# Patient Record
Sex: Female | Born: 1950 | Race: White | Hispanic: No | Marital: Married | Smoking: Never smoker
Health system: Southern US, Community
[De-identification: ages and names within clinical notes are randomized; demographics above are authoritative.]

## PROBLEM LIST (undated history)

## (undated) DIAGNOSIS — F411 Generalized anxiety disorder: Secondary | ICD-10-CM

## (undated) DIAGNOSIS — G9332 Myalgic encephalomyelitis/chronic fatigue syndrome: Secondary | ICD-10-CM

## (undated) DIAGNOSIS — R5382 Chronic fatigue, unspecified: Secondary | ICD-10-CM

## (undated) DIAGNOSIS — K589 Irritable bowel syndrome without diarrhea: Secondary | ICD-10-CM

## (undated) HISTORY — PX: CHOLECYSTECTOMY: SHX55

## (undated) HISTORY — DX: Myalgic encephalomyelitis/chronic fatigue syndrome: G93.32

## (undated) HISTORY — DX: Irritable bowel syndrome, unspecified: K58.9

## (undated) HISTORY — DX: Chronic fatigue, unspecified: R53.82

## (undated) HISTORY — DX: Generalized anxiety disorder: F41.1

---

## 1998-05-13 ENCOUNTER — Other Ambulatory Visit: Admission: RE | Admit: 1998-05-13 | Discharge: 1998-05-13 | Payer: Self-pay | Admitting: *Deleted

## 1999-05-21 ENCOUNTER — Other Ambulatory Visit: Admission: RE | Admit: 1999-05-21 | Discharge: 1999-05-21 | Payer: Self-pay | Admitting: *Deleted

## 2000-06-02 ENCOUNTER — Other Ambulatory Visit: Admission: RE | Admit: 2000-06-02 | Discharge: 2000-06-02 | Payer: Self-pay | Admitting: Obstetrics & Gynecology

## 2001-05-04 ENCOUNTER — Other Ambulatory Visit: Admission: RE | Admit: 2001-05-04 | Discharge: 2001-05-04 | Payer: Self-pay | Admitting: *Deleted

## 2009-02-24 ENCOUNTER — Emergency Department: Payer: Self-pay | Admitting: Emergency Medicine

## 2009-07-05 ENCOUNTER — Ambulatory Visit: Payer: Self-pay | Admitting: Family Medicine

## 2009-07-11 LAB — HM COLONOSCOPY: HM Colonoscopy: NORMAL

## 2010-09-09 LAB — HM PAP SMEAR: HM Pap smear: NORMAL

## 2010-10-04 ENCOUNTER — Ambulatory Visit: Payer: Self-pay | Admitting: Internal Medicine

## 2011-01-13 ENCOUNTER — Ambulatory Visit: Payer: Self-pay | Admitting: Internal Medicine

## 2011-02-08 ENCOUNTER — Encounter: Payer: Self-pay | Admitting: Internal Medicine

## 2011-02-21 ENCOUNTER — Telehealth: Payer: Self-pay | Admitting: Internal Medicine

## 2011-02-21 DIAGNOSIS — K589 Irritable bowel syndrome without diarrhea: Secondary | ICD-10-CM

## 2011-02-21 NOTE — Telephone Encounter (Signed)
Can patient get referral to GI? Please advise.

## 2011-02-21 NOTE — Telephone Encounter (Signed)
She will need a referral to either [facility] or [facility] for Irritable Bowel Syndrome.  Does she have a preference?

## 2011-03-08 ENCOUNTER — Other Ambulatory Visit: Payer: Self-pay | Admitting: Internal Medicine

## 2011-03-08 MED ORDER — DEXLANSOPRAZOLE 60 MG PO CPDR: 60.0000 mg | DELAYED_RELEASE_CAPSULE | Freq: Every day | ORAL | Status: DC

## 2011-03-09 NOTE — Telephone Encounter (Signed)
I have been waiting for patient to return my call.  She called back today and she wants to be referred to Dr. Skeet Simmer at [facility] for IBS and acid reflux.  I wanted to make sure this was still ok with you?  Please advise.

## 2011-03-10 NOTE — Telephone Encounter (Signed)
i i ordered the referral but did not print ,  Can you  Print a nd give to shannon>?

## 2011-03-10 NOTE — Telephone Encounter (Signed)
Yes it did print, once the order is signed I will give to [facility].

## 2011-03-10 NOTE — Telephone Encounter (Signed)
Yes, ok to refer to [employer].

## 2011-07-26 ENCOUNTER — Other Ambulatory Visit: Payer: Self-pay | Admitting: Internal Medicine

## 2011-10-10 ENCOUNTER — Encounter: Payer: Self-pay | Admitting: Internal Medicine

## 2011-10-10 ENCOUNTER — Ambulatory Visit (INDEPENDENT_AMBULATORY_CARE_PROVIDER_SITE_OTHER): Payer: BC Managed Care – PPO | Admitting: Internal Medicine

## 2011-10-10 VITALS — BP 128/80 | HR 93 | Temp 98.0°F | Resp 16 | Ht 66.0 in | Wt 179.2 lb

## 2011-10-10 DIAGNOSIS — G43909 Migraine, unspecified, not intractable, without status migrainosus: Secondary | ICD-10-CM | POA: Insufficient documentation

## 2011-10-10 DIAGNOSIS — K589 Irritable bowel syndrome without diarrhea: Secondary | ICD-10-CM | POA: Insufficient documentation

## 2011-10-10 DIAGNOSIS — Z6825 Body mass index (BMI) 25.0-25.9, adult: Secondary | ICD-10-CM

## 2011-10-10 DIAGNOSIS — G9332 Myalgic encephalomyelitis/chronic fatigue syndrome: Secondary | ICD-10-CM | POA: Insufficient documentation

## 2011-10-10 DIAGNOSIS — F411 Generalized anxiety disorder: Secondary | ICD-10-CM | POA: Insufficient documentation

## 2011-10-10 DIAGNOSIS — E663 Overweight: Secondary | ICD-10-CM

## 2011-10-10 DIAGNOSIS — Z13220 Encounter for screening for lipoid disorders: Secondary | ICD-10-CM

## 2011-10-10 DIAGNOSIS — R5382 Chronic fatigue, unspecified: Secondary | ICD-10-CM

## 2011-10-10 MED ORDER — AMPHETAMINE-DEXTROAMPHETAMINE 5 MG PO TABS: 10.0000 mg | ORAL_TABLET | Freq: Every day | ORAL | Status: DC

## 2011-10-10 MED ORDER — ESCITALOPRAM OXALATE 20 MG PO TABS: 20.0000 mg | ORAL_TABLET | Freq: Every day | ORAL | Status: DC

## 2011-10-10 MED ORDER — ALPRAZOLAM 0.5 MG PO TABS: 0.5000 mg | ORAL_TABLET | Freq: Every evening | ORAL | Status: DC | PRN

## 2011-10-10 MED ORDER — MELOXICAM 15 MG PO TABS: 15.0000 mg | ORAL_TABLET | Freq: Every day | ORAL | Status: DC

## 2011-10-10 MED ORDER — SUMATRIPTAN SUCCINATE 100 MG PO TABS: 100.0000 mg | ORAL_TABLET | ORAL | Status: DC | PRN

## 2011-10-10 NOTE — Assessment & Plan Note (Addendum)
managed with lexapro and adderall bid. peititon to continue adderall and name brand lexapro to be sent to insurance. Advised to continue generic lexapro to avoid withdrawal.

## 2011-10-10 NOTE — Assessment & Plan Note (Addendum)
Managed with Dexilant, probiotics, alprazolam and zofran. food avoidances include lactose.

## 2011-10-10 NOTE — Progress Notes (Signed)
Patient ID: Brandi Douglas, female   DOB: June 03, 1951, 61 y.o.   MRN: [id number]   Follow up on multiple syndromes including chronicfatigue, generalized anxiety.  Symptoms have been managed well for years on current regimen but her insurance has substituted generic formualtions and requested prior authorizations for medicatison whe has been taking for years.  Her  IBS abdominal symptoms include bloating constipation and weight gain complicated by Lactose intolerance.  Her chief complaint today is nightmares which started occurring nightly after switching lexapro to generic formulation .   The nightmares are occasionally waking her up and  are causing her increased anxiety .  Common theme of nightmares is loss of control. The dexilant has been helping the bloating and she was able to lose weight since starting it. Her 3rd issue is the sumatriptan generic formulation that she takes for migraine has not completely resolved her headaches, which are occurring twice weekly.  The most recent formulation came from [place] (filled at [facility]) and her headache was not completely resolved.

## 2011-10-10 NOTE — Assessment & Plan Note (Addendum)
Managed with Lexapro, has not tolerated generic lexapro due to nightmares.  Generic alprazolam ok .

## 2011-10-10 NOTE — Assessment & Plan Note (Addendum)
Managed with sumatriptan.  Headache also resulting from referred pain from shoulder, per massage therapist,  Pain starts in neck and shoulders.

## 2011-10-10 NOTE — Assessment & Plan Note (Signed)
Managed with dexilant, lactaid,  And food avoidance

## 2011-10-13 ENCOUNTER — Encounter: Payer: Self-pay | Admitting: Gastroenterology

## 2011-10-13 ENCOUNTER — Emergency Department (HOSPITAL_COMMUNITY)
Admission: EM | Admit: 2011-10-13 | Discharge: 2011-10-13 | Disposition: A | Discharge status: Home / Self Care | Payer: BC Managed Care – PPO | Attending: Emergency Medicine | Admitting: Emergency Medicine

## 2011-10-13 ENCOUNTER — Encounter (HOSPITAL_COMMUNITY): Payer: Self-pay | Admitting: *Deleted

## 2011-10-13 ENCOUNTER — Emergency Department (HOSPITAL_COMMUNITY): Payer: BC Managed Care – PPO

## 2011-10-13 ENCOUNTER — Telehealth: Payer: Self-pay | Admitting: Internal Medicine

## 2011-10-13 DIAGNOSIS — R143 Flatulence: Secondary | ICD-10-CM | POA: Insufficient documentation

## 2011-10-13 DIAGNOSIS — R197 Diarrhea, unspecified: Secondary | ICD-10-CM | POA: Insufficient documentation

## 2011-10-13 DIAGNOSIS — R1084 Generalized abdominal pain: Secondary | ICD-10-CM | POA: Insufficient documentation

## 2011-10-13 DIAGNOSIS — R141 Gas pain: Secondary | ICD-10-CM | POA: Insufficient documentation

## 2011-10-13 DIAGNOSIS — Z79899 Other long term (current) drug therapy: Secondary | ICD-10-CM | POA: Insufficient documentation

## 2011-10-13 DIAGNOSIS — R142 Eructation: Secondary | ICD-10-CM | POA: Insufficient documentation

## 2011-10-13 DIAGNOSIS — R110 Nausea: Secondary | ICD-10-CM | POA: Insufficient documentation

## 2011-10-13 DIAGNOSIS — K589 Irritable bowel syndrome without diarrhea: Secondary | ICD-10-CM | POA: Insufficient documentation

## 2011-10-13 DIAGNOSIS — R630 Anorexia: Secondary | ICD-10-CM | POA: Insufficient documentation

## 2011-10-13 LAB — DIFFERENTIAL
Basophils Absolute: 0 10*3/uL (ref 0.0–0.1)
Basophils Relative: 0 % (ref 0–1)
Eosinophils Absolute: 0 10*3/uL (ref 0.0–0.7)
Monocytes Relative: 7 % (ref 3–12)
Neutro Abs: 3.6 10*3/uL (ref 1.7–7.7)
Neutrophils Relative %: 61 % (ref 43–77)

## 2011-10-13 LAB — CBC
MCH: 32 pg (ref 26.0–34.0)
MCHC: 36 g/dL (ref 30.0–36.0)
Platelets: 175 10*3/uL (ref 150–400)
RDW: 12.2 % (ref 11.5–15.5)

## 2011-10-13 LAB — COMPREHENSIVE METABOLIC PANEL
AST: 21 U/L (ref 0–37)
Albumin: 4 g/dL (ref 3.5–5.2)
Alkaline Phosphatase: 86 U/L (ref 39–117)
BUN: 9 mg/dL (ref 6–23)
Chloride: 100 mEq/L (ref 96–112)
Potassium: 3.8 mEq/L (ref 3.5–5.1)
Sodium: 136 mEq/L (ref 135–145)
Total Bilirubin: 0.4 mg/dL (ref 0.3–1.2)
Total Protein: 6.7 g/dL (ref 6.0–8.3)

## 2011-10-13 LAB — LIPASE, BLOOD: Lipase: 47 U/L (ref 11–59)

## 2011-10-13 MED ORDER — DICYCLOMINE HCL 20 MG PO TABS: 20.0000 mg | ORAL_TABLET | Freq: Two times a day (BID) | ORAL | Status: DC

## 2011-10-13 NOTE — Discharge Instructions (Signed)
Irritable Bowel Syndrome Irritable Bowel Syndrome (IBS) is caused by a disturbance of normal bowel function. Other terms used are spastic colon, mucous colitis, and irritable colon. It does not require surgery, nor does it lead to cancer. There is no cure for IBS. But with proper diet, stress reduction, and medication, you will find that your problems (symptoms) will gradually disappear or improve. IBS is a common digestive disorder. It usually appears in late adolescence or early adulthood. Women develop it twice as often as men. CAUSES  After food has been digested and absorbed in the small intestine, waste material is moved into the colon (large intestine). In the colon, water and salts are absorbed from the undigested products coming from the small intestine. The remaining residue, or fecal material, is held for elimination. Under normal circumstances, gentle, rhythmic contractions on the bowel walls push the fecal material along the colon towards the rectum. In IBS, however, these contractions are irregular and poorly coordinated. The fecal material is either retained too long, resulting in constipation, or expelled too soon, producing diarrhea. SYMPTOMS  The most common symptom of IBS is pain. It is typically in the lower left side of the belly (abdomen). But it may occur anywhere in the abdomen. It can be felt as heartburn, backache, or even as a dull pain in the arms or shoulders. The pain comes from excessive bowel-muscle spasms and from the buildup of gas and fecal material in the colon. This pain:  Can range from sharp belly (abdominal) cramps to a dull, continuous ache.   Usually worsens soon after eating.   Is typically relieved by having a bowel movement or passing gas.  Abdominal pain is usually accompanied by constipation. But it may also produce diarrhea. The diarrhea typically occurs right after a meal or upon arising in the morning. The stools are typically soft and watery. They are  often flecked with secretions (mucus). Other symptoms of IBS include:  Bloating.   Loss of appetite.   Heartburn.   Feeling sick to your stomach (nausea).   Belching   Vomiting   Gas.  IBS may also cause a number of symptoms that are unrelated to the digestive system:  Fatigue.   Headaches.   Anxiety   Shortness of breath   Difficulty in concentrating.   Dizziness.  These symptoms tend to come and go. DIAGNOSIS  The symptoms of IBS closely mimic the symptoms of other, more serious digestive disorders. So your caregiver may wish to perform a variety of additional tests to exclude these disorders. He/she wants to be certain of learning what is wrong (diagnosis). The nature and purpose of each test will be explained to you. TREATMENT A number of medications are available to help correct bowel function and/or relieve bowel spasms and abdominal pain. Among the drugs available are:  Mild, non-irritating laxatives for severe constipation and to help restore normal bowel habits.   Specific anti-diarrheal medications to treat severe or prolonged diarrhea.   Anti-spasmodic agents to relieve intestinal cramps.   Your caregiver may also decide to treat you with a mild tranquilizer or sedative during unusually stressful periods in your life.  The important thing to remember is that if any drug is prescribed for you, make sure that you take it exactly as directed. Make sure that your caregiver knows how well it worked for you. HOME CARE INSTRUCTIONS   Avoid foods that are high in fat or oils. Some examples are:heavy cream, butter, frankfurters, sausage, and other fatty   meats.   Avoid foods that have a laxative effect, such as fruit, fruit juice, and dairy products.   Cut out carbonated drinks, chewing gum, and "gassy" foods, such as beans and cabbage. This may help relieve bloating and belching.   Bran taken with plenty of liquids may help relieve constipation.   Keep track of  what foods seem to trigger your symptoms.   Avoid emotionally charged situations or circumstances that produce anxiety.   Start or continue exercising.   Get plenty of rest and sleep.  MAKE SURE YOU:   Understand these instructions.   Will watch your condition.   Will get help right away if you are not doing well or get worse.  Document Released: 06/20/2005 Document Revised: 06/09/2011 Document Reviewed: 02/08/2008 [facility] Patient Information 2012 [place], [place].High Fiber Diet A high fiber diet changes your normal diet to include more whole grains, legumes, fruits, and vegetables. Changes in the diet involve replacing refined carbohydrates with unrefined foods. The calorie level of the diet is essentially unchanged. The Dietary Reference Intake (recommended amount) for adult males is 38 g per day. For adult females, it is 25 g per day. Pregnant and lactating women should consume 28 g of fiber per day. Fiber is the intact part of a plant that is not broken down during digestion. Functional fiber is fiber that has been isolated from the plant to provide a beneficial effect in the body. PURPOSE  Increase stool bulk.   Ease and regulate bowel movements.   Lower cholesterol.  INDICATIONS THAT YOU NEED MORE FIBER  Constipation and hemorrhoids.   Uncomplicated diverticulosis (intestine condition) and irritable bowel syndrome.   Weight management.   As a protective measure against hardening of the arteries (atherosclerosis), diabetes, and cancer.  NOTE OF CAUTION If you have a digestive or bowel problem, ask your caregiver for advice before adding high fiber foods to your diet. Some of the following medical problems are such that a high fiber diet should not be used without consulting your caregiver:  Acute diverticulitis (intestine infection).   Partial small bowel obstructions.   Complicated diverticular disease involving bleeding, rupture (perforation), or abscess (boil,  furuncle).   Presence of autonomic neuropathy (nerve damage) or gastric paresis (stomach cannot empty itself).  GUIDELINES FOR INCREASING FIBER  Start adding fiber to the diet slowly. A gradual increase of about 5 more grams (2 slices of whole-wheat bread, 2 servings of most fruits or vegetables, or 1 bowl of high fiber cereal) per day is best. Too rapid an increase in fiber may result in constipation, flatulence, and bloating.   Drink enough water and fluids to keep your urine clear or pale yellow. Water, juice, or caffeine-free drinks are recommended. Not drinking enough fluid may cause constipation.   Eat a variety of high fiber foods rather than one type of fiber.   Try to increase your intake of fiber through using high fiber foods rather than fiber pills or supplements that contain small amounts of fiber.   The goal is to change the types of food eaten. Do not supplement your present diet with high fiber foods, but replace foods in your present diet.  INCLUDE A VARIETY OF FIBER SOURCES  Replace refined and processed grains with whole grains, canned fruits with fresh fruits, and incorporate other fiber sources. White rice, white breads, and most bakery goods contain little or no fiber.   Brown whole-grain rice, buckwheat oats, and many fruits and vegetables are all good sources of  fiber. These include: broccoli, Brussels sprouts, cabbage, cauliflower, beets, sweet potatoes, white potatoes (skin on), carrots, tomatoes, eggplant, squash, berries, fresh fruits, and dried fruits.   Cereals appear to be the richest source of fiber. Cereal fiber is found in whole grains and bran. Bran is the fiber-rich outer coat of cereal grain, which is largely removed in refining. In whole-grain cereals, the bran remains. In breakfast cereals, the largest amount of fiber is found in those with "bran" in their names. The fiber content is sometimes indicated on the label.   You may need to include additional  fruits and vegetables each day.   In baking, for 1 cup white flour, you may use the following substitutions:   1 cup whole-wheat flour minus 2 tbs.    cup white flour plus  cup whole-wheat flour.  Document Released: 06/20/2005 Document Revised: 06/09/2011 Document Reviewed: 04/28/2009 [facility] Patient Information 2012 [place], [place].Bloating Bloating is the feeling of fullness in your belly. You may feel as though your pants are too tight. Often the cause of bloating is overeating, retaining fluids, or having gas in your bowel. It is also caused by swallowing air and eating foods that cause gas. Irritable bowel syndrome is one of the most common causes of bloating. Constipation is also a common cause. Sometimes more serious problems can cause bloating. SYMPTOMS  Usually there is a feeling of fullness, as though your abdomen is bulged out. There may be mild discomfort.  DIAGNOSIS  Usually no particular testing is necessary for most bloating. If the condition persists and seems to become worse, your caregiver may do additional testing.  TREATMENT   There is no direct treatment for bloating.   Do not put gas into the bowel. Avoid chewing gum and sucking on candy. These tend to make you swallow air. Swallowing air can also be a nervous habit. Try to avoid this.   Avoiding high residue diets will help. Eat foods with soluble fibers (examples include root vegetables, apples, or barley) and substitute dairy products with soy and rice products. This helps irritable bowel syndrome.   If constipation is the cause, then a high residue diet with more fiber will help.   Avoid carbonated beverages.   Over-the-counter preparations are available that help reduce gas. Your pharmacist can help you with this.  SEEK MEDICAL CARE IF:   Bloating continues and seems to be getting worse.   You notice a weight gain.   You have a weight loss but the bloating is getting worse.   You have changes in your  bowel habits or develop nausea or vomiting.  SEEK IMMEDIATE MEDICAL CARE IF:   You develop shortness of breath or swelling in your legs.   You have an increase in abdominal pain or develop chest pain.  Document Released: 04/20/2006 Document Revised: 06/09/2011 Document Reviewed: 06/08/2007 [facility] Patient Information 2012 [place], [place].

## 2011-10-13 NOTE — ED Notes (Signed)
Patient states that she has had abd bloating since Monday.  Pt has had nausea with same, no vomiting.  Pt states that she had had diarrhea since yesterday.  Pt went to PCP and sent here for further evaluation of same.  Pt states that she has pain with same.  Pt also has pain in her lower back.

## 2011-10-13 NOTE — ED Notes (Signed)
Pt to ED with c/o of abd swelling and diarrhea.  Also c/o nausea without vomiting.   Pain in lower back

## 2011-10-13 NOTE — Telephone Encounter (Signed)
Left message asking patient to return my call.

## 2011-10-13 NOTE — Telephone Encounter (Signed)
Patient wants to see a gastroenterologist today or tomorrow  has a lot of swelling ,afraid to eat anything.

## 2011-10-13 NOTE — ED Provider Notes (Addendum)
History     CSN: [id number]  Arrival date & time 10/13/11  1519   First MD Initiated Contact with Patient 10/13/11 1656      Chief Complaint  Patient presents with  . Abdominal Pain    (Consider location/radiation/quality/duration/timing/severity/associated sxs/prior treatment) Patient is a 61 y.o. female presenting with abdominal pain. The history is provided by the patient and the spouse.  Abdominal Pain The primary symptoms of the illness include abdominal pain, nausea and diarrhea. The primary symptoms of the illness do not include vomiting. Primary symptoms comment: Abdominal distention Episode onset: 4 days ago. The onset of the illness was gradual. The problem has been gradually worsening.  The abdominal pain is generalized. The abdominal pain does not radiate. The severity of the abdominal pain is 1/10. The abdominal pain is relieved by nothing. The abdominal pain is exacerbated by eating.  The diarrhea began 2 days ago. The diarrhea is watery. The diarrhea occurs 2 to 4 times per day.  The patient has had a change in bowel habit. Additional symptoms associated with the illness include anorexia. Symptoms associated with the illness do not include chills. Associated medical issues comments: History of IBS with intermittent constipation and diarrhea.    Past Medical History  Diagnosis Date  . Chronic fatigue syndrome   . Generalized anxiety disorder   . Irritable bowel syndrome     Past Surgical History  Procedure Date  . Cholecystectomy     History reviewed. No pertinent family history.  History  Substance Use Topics  . Smoking status: Never Smoker   . Smokeless tobacco: Never Used  . Alcohol Use: No    OB History    Grav Para Term Preterm Abortions TAB SAB Ect Mult Living                  Review of Systems  Constitutional: Negative for chills.  Gastrointestinal: Positive for nausea, abdominal pain, diarrhea and anorexia. Negative for vomiting.  All other  systems reviewed and are negative.    Allergies  Aspirin  Home Medications   Current Outpatient Rx  Name Route Sig Dispense Refill  . ALPRAZOLAM 0.5 MG PO TABS Oral Take 0.25-0.5 mg by mouth 2 (two) times daily as needed. For anxiety    . AMPHETAMINE-DEXTROAMPHETAMINE 5 MG PO TABS Oral Take 5 mg by mouth See admin instructions. Pt takes 1 tab in the morning & may take 1 additional tab in the evening if needed    . DEXLANSOPRAZOLE 60 MG PO CPDR Oral Take 60 mg by mouth daily.     Marland Kitchen ESCITALOPRAM OXALATE 20 MG PO TABS Oral Take 20 mg by mouth daily.    . MELOXICAM 15 MG PO TABS Oral Take 15 mg by mouth daily as needed. For pain    . ONE-DAILY MULTI VITAMINS PO TABS Oral Take 1 tablet by mouth daily.    Marland Kitchen ONDANSETRON HCL 8 MG PO TABS Oral Take 8 mg by mouth every 8 (eight) hours as needed. For nausea    . ALIGN PO Oral Take 1 tablet by mouth daily.     . SUMATRIPTAN SUCCINATE 100 MG PO TABS Oral Take 100 mg by mouth every 2 (two) hours as needed. For migraines    . TOLTERODINE TARTRATE ER 4 MG PO CP24 Oral Take 4 mg by mouth daily.      BP 120/77  Pulse 94  Temp(Src) 98.2 F (36.8 C) (Oral)  Resp 16  SpO2 100%  Physical Exam  Nursing note and vitals reviewed. Constitutional: She is oriented to person, place, and time. She appears well-developed and well-nourished. No distress.  HENT:  Head: Normocephalic and atraumatic.  Mouth/Throat: Oropharynx is clear and moist.  Eyes: EOM are normal. Pupils are equal, round, and reactive to light.  Cardiovascular: Normal rate, regular rhythm, normal heart sounds and intact distal pulses.  Exam reveals no friction rub.   No murmur heard. Pulmonary/Chest: Effort normal and breath sounds normal. She has no wheezes. She has no rales.  Abdominal: Soft. She exhibits distension. Bowel sounds are decreased. There is no tenderness. There is no rebound and no guarding.  Musculoskeletal: Normal range of motion. She exhibits no tenderness.       No  edema  Neurological: She is alert and oriented to person, place, and time. No cranial nerve deficit.  Skin: Skin is warm and dry. No rash noted.  Psychiatric: She has a normal mood and affect. Her behavior is normal.    ED Course  Procedures (including critical care time)   Labs Reviewed  CBC  DIFFERENTIAL  COMPREHENSIVE METABOLIC PANEL  LIPASE, BLOOD   Dg Abd Acute W/chest  10/13/2011  *RADIOLOGY REPORT*  Clinical Data: Abdominal distension and pain.  ACUTE ABDOMEN SERIES (ABDOMEN 2 VIEW & CHEST 1 VIEW)  Comparison: No priors.  Findings: Lung volumes are normal.  No consolidative airspace disease.  No pleural effusions.  No pneumothorax.  No pulmonary nodule or mass noted.  Pulmonary vasculature and the cardiomediastinal silhouette are within normal limits.  No pneumoperitoneum.  Supine and upright views of the abdomen demonstrate gas and stool scattered throughout the colon extending to the level of the rectum.  No pathologic distension of small bowel is noted. Surgical clips project over the right upper quadrant of the abdomen, suggesting prior cholecystectomy.  IMPRESSION: 1.  Nonobstructive bowel gas pattern. 2.  No pneumoperitoneum. 3.  Surgical clips projecting over the right upper quadrant of the abdomen, suggesting prior cholecystectomy. 4.  No radiographic evidence of acute cardiopulmonary disease.  Original Report Authenticated By: Florencia Reasons, M.D.     1. IBS (irritable bowel syndrome)       MDM   Patient with a long history of IBS with intermittent bloating, constipation and diarrhea. For the last 4 days patient has had worsening abdominal distention and bloating with frequent diarrhea. She denies fever, vomiting, shortness of breath, cough and states her abdomen feels tight but she has no focal abdominal pain. Patient has not changed any medication except for taking the generic Lexapro. She states this has happened in the past and has been seen at [facility] and [facility]  and nothing was found. She used to be on a medication for IBS but they took off the market in them for a while she was taking Reglan but because of it causing twitching she stopped it. Abdomen is distended on exam today but is soft and nontender. She does have decreased bowel sounds. Acute abdominal series, CBC, CMP, lipase pending.  6:41 PM Labs within normal limits. X-ray unrevealing except for some stool. Discussed with patient Will start her on Bentyl and she has followup with GI at the end of April.     Gwyneth Sprout, MD 10/13/11 1842  Gwyneth Sprout, MD 10/13/11 1843

## 2011-10-13 NOTE — Telephone Encounter (Signed)
I was notified from [place] that patient went to Urgent Care

## 2011-10-28 ENCOUNTER — Other Ambulatory Visit: Payer: Self-pay | Admitting: Internal Medicine

## 2011-10-28 ENCOUNTER — Telehealth: Payer: Self-pay | Admitting: Gastroenterology

## 2011-10-28 ENCOUNTER — Telehealth: Payer: Self-pay | Admitting: *Deleted

## 2011-10-28 MED ORDER — ESCITALOPRAM OXALATE 20 MG PO TABS: 20.0000 mg | ORAL_TABLET | Freq: Every day | ORAL | Status: DC

## 2011-10-28 NOTE — Telephone Encounter (Signed)
Called patient and advised her that she will need to have all records from Dr West Pugh at [facility] in [place] she last saw him 04/12/2011. I have advised her that her appt will be cxed until her records are received and reviewed by Dr Jarold Motto or another MD and she is accepted into the practice. She states that she understands and will have her records faxed she was given the fax number.

## 2011-10-28 NOTE — Telephone Encounter (Signed)
Error

## 2011-10-31 NOTE — Telephone Encounter (Signed)
Per Dr Jarold Motto he does not feel he has anything to offer the patient she has already seen [facility] and Dr West Pugh. I have left her a very detailed message and advised her I will hold on to her records for 72 hours then discard them. She can call me back and let me know if she would like to ask another MD in the practice, but she does not have a appt with our practice at this time.

## 2011-11-01 ENCOUNTER — Ambulatory Visit: Payer: BC Managed Care – PPO | Admitting: Gastroenterology

## 2011-11-01 NOTE — Telephone Encounter (Signed)
Patient would like our office to hold on to her records from Dr West Pugh until she has her next office visit with Dr Darrick Huntsman and she will discuss being referred to another GI MD at [facility] I have advised her that the same process will need to be followed when she is referred back she will have to have her records reviewed by that MD and they might even want her [facility] records for review as well since she has had a extensive history with them for "many years". She verbalized understanding I gave her the fax number again to have her [facility] records sent to our office. I will route this message to Dr Darrick Huntsman.

## 2011-11-02 ENCOUNTER — Other Ambulatory Visit: Payer: Self-pay | Admitting: Internal Medicine

## 2011-11-02 MED ORDER — DICYCLOMINE HCL 20 MG PO TABS: 20.0000 mg | ORAL_TABLET | Freq: Two times a day (BID) | ORAL | Status: DC

## 2011-11-02 NOTE — Telephone Encounter (Signed)
[phone] Pt would like to get a refil on dicyclomine hcl 20mg  tab take one tablet by mouth 2 times daily  This was prescribed by the [facility] for bloating medicap parmacy

## 2011-11-11 ENCOUNTER — Encounter: Payer: Self-pay | Admitting: Internal Medicine

## 2011-11-11 ENCOUNTER — Ambulatory Visit (INDEPENDENT_AMBULATORY_CARE_PROVIDER_SITE_OTHER): Payer: BC Managed Care – PPO | Admitting: Internal Medicine

## 2011-11-11 ENCOUNTER — Other Ambulatory Visit (INDEPENDENT_AMBULATORY_CARE_PROVIDER_SITE_OTHER): Payer: BC Managed Care – PPO | Admitting: *Deleted

## 2011-11-11 VITALS — BP 122/78 | HR 96 | Temp 98.5°F | Resp 16 | Ht 66.0 in | Wt 181.0 lb

## 2011-11-11 DIAGNOSIS — R5382 Chronic fatigue, unspecified: Secondary | ICD-10-CM

## 2011-11-11 DIAGNOSIS — K589 Irritable bowel syndrome without diarrhea: Secondary | ICD-10-CM

## 2011-11-11 DIAGNOSIS — Z13220 Encounter for screening for lipoid disorders: Secondary | ICD-10-CM

## 2011-11-11 DIAGNOSIS — G9332 Myalgic encephalomyelitis/chronic fatigue syndrome: Secondary | ICD-10-CM

## 2011-11-11 DIAGNOSIS — Z0000 Encounter for general adult medical examination without abnormal findings: Secondary | ICD-10-CM

## 2011-11-11 LAB — LIPID PANEL
Cholesterol: 162 mg/dL (ref 0–200)
LDL Cholesterol: 93 mg/dL (ref 0–99)
Total CHOL/HDL Ratio: 3

## 2011-11-11 MED ORDER — ESOMEPRAZOLE MAGNESIUM 40 MG PO CPDR: 40.0000 mg | DELAYED_RELEASE_CAPSULE | Freq: Every day | ORAL | Status: DC

## 2011-11-11 MED ORDER — DICYCLOMINE HCL 20 MG PO TABS: 20.0000 mg | ORAL_TABLET | Freq: Two times a day (BID) | ORAL | Status: DC

## 2011-11-11 NOTE — Patient Instructions (Signed)
Consider the Low Glycemic Index Diet and 6 smaller meals daily .  This boosts your metabolism and regulates your sugars:   7 AM Low carbohydrate Protein  Shakes (EAS Carb Control  Or Atkins ,  Available everywhere,   In  cases at [employer] )  2.5 carbs  (Add or substitute a toasted sandwhich thin w/ peanut butter)  10 AM: Protein bar by Atkins (snack size,  Chocolate lover's variety at  [employer])    Lunch: sandwich on pita bread or flatbread (Joseph's makes a pita bread and a flat bread , available at [employer] and [employer]; Toufayah makes a low carb flatbread available at [employer] and [employer]) Mission makes a low carb whole wheat tortilla available at [employer] (has 26 g fiber)   3 PM:  Mid day :  Another protein bar,  Or a  cheese stick, 1/4 cup of almonds, walnuts, pistachios, pecans, peanuts,  Macadamia nuts  6 PM  Dinner:  "mean and green:"  Meat/chicken/fish, salad, and green veggie : use ranch, vinagrette,  Blue cheese, etc  9 PM snack : Breyer's low carb fudgsicle or  ice cream bar (Carb Smart), or  Weight Watcher's ice cream bar , or another protein shake

## 2011-11-11 NOTE — Progress Notes (Signed)
Patient ID: Brandi Douglas, female   DOB: 1951/01/10, 61 y.o.   MRN: [id number]  Patient Active Problem List  Diagnoses  . Chronic fatigue syndrome  . Generalized anxiety disorder  . Irritable bowel syndrome (IBS)  . Headache, migraine  . Irritable bowel syndrome  . Routine general medical examination at a health care facility    Subjective:  CC:   Chief Complaint  Patient presents with  . Annual Exam    HPI:   Brandi Douglas a 61 y.o. female who presents for her annual non-GYN exam. She had a Recent ER visit for abdominal bloating secondary to constipation,  Was started on Bentyl.  She is also requesting a switch back to nexium bc dexilant was causing odd nonviolent in content nightmares that increased anxiety.  finally she has Had to take generic lexapro despite the letter I wrote about needing name brand lexapro and adderall, because her insurance refused to cover either at mammogram. She does feel that her bowels are better and her abdominal distention has improved since taking the Benadryl twice daily. She exercises occasionally. She wears her seatbelts whenever she drives or is a passenger in a car. She has no history of current or past of domestic violence. She is a nonsmoker.  Past Medical History  Diagnosis Date  . Chronic fatigue syndrome   . Generalized anxiety disorder   . Irritable bowel syndrome     Past Surgical History  Procedure Date  . Cholecystectomy          The following portions of the patient's history were reviewed and updated as appropriate: Allergies, current medications, and problem list.    Review of Systems:   12 Pt  review of systems was negative except those addressed in the HPI,     History   Social History  . Marital Status: Married    Spouse Name: Renae Fickle     Number of Children: N/A  . Years of Education: N/A   Occupational History  . dental hygienist    Social History Main Topics  . Smoking status: Never Smoker   . Smokeless  tobacco: Never Used  . Alcohol Use: No  . Drug Use: No  . Sexually Active: Yes -- Female partner(s)   Other Topics Concern  . Not on file   Social History Narrative  . No narrative on file    Objective:  BP 122/78  Pulse 96  Temp(Src) 98.5 F (36.9 C) (Oral)  Resp 16  Ht 5\' 6"  (1.676 m)  Wt 181 lb (82.101 kg)  BMI 29.21 kg/m2  SpO2 96%  General appearance: alert, cooperative and appears stated age Ears: normal TM's and external ear canals both ears Throat: lips, mucosa, and tongue normal; teeth and gums normal Neck: no adenopathy, no carotid bruit, supple, symmetrical, trachea midline and thyroid not enlarged, symmetric, no tenderness/mass/nodules Back: symmetric, no curvature. ROM normal. No CVA tenderness. Lungs: clear to auscultation bilaterally Heart: regular rate and rhythm, S1, S2 normal, no murmur, click, rub or gallop Abdomen: soft, non-tender; bowel sounds normal; no masses,  no organomegaly Pulses: 2+ and symmetric Skin: Skin color, texture, turgor normal. No rashes or lesions Lymph nodes: Cervical, supraclavicular, and axillary nodes normal.  Assessment and Plan:  Irritable bowel syndrome (IBS) Improved with use of Bentyl. I have also recommended a high-fiber diet. She may use the Bentyl up to 3-4 times daily. Continue to use Mylanta gas as needed for gaseous distention.  Routine general medical examination at a health care  facility A comprehensive none GYN exam was done today. Her Pap smears and mammograms are managed by her gynecologist at [facility].    Updated Medication List Outpatient Encounter Prescriptions as of 11/11/2011  Medication Sig Dispense Refill  . ALPRAZolam (XANAX) 0.5 MG tablet Take 0.25-0.5 mg by mouth 2 (two) times daily as needed. For anxiety      . amphetamine-dextroamphetamine (ADDERALL) 5 MG tablet Take 5 mg by mouth See admin instructions. Pt takes 1 tab in the morning & may take 1 additional tab in the evening if needed      .  cholecalciferol (VITAMIN D) 1000 UNITS tablet Take 1,000 Units by mouth daily.      Marland Kitchen dicyclomine (BENTYL) 20 MG tablet Take 1 tablet (20 mg total) by mouth 2 (two) times daily.  90 tablet  2  . escitalopram (LEXAPRO) 20 MG tablet Take 1 tablet (20 mg total) by mouth daily.  30 tablet  3  . esomeprazole (NEXIUM) 40 MG capsule Take 1 capsule (40 mg total) by mouth daily before breakfast.  30 capsule  11  . meloxicam (MOBIC) 15 MG tablet Take 15 mg by mouth daily as needed. For pain      . Multiple Vitamin (MULTIVITAMIN) tablet Take 1 tablet by mouth daily.      . ondansetron (ZOFRAN) 8 MG tablet Take 8 mg by mouth every 8 (eight) hours as needed. For nausea      . Probiotic Product (ALIGN PO) Take 1 tablet by mouth daily.       . SUMAtriptan (IMITREX) 100 MG tablet Take 100 mg by mouth every 2 (two) hours as needed. For migraines      . tolterodine (DETROL LA) 4 MG 24 hr capsule Take 4 mg by mouth daily.      Marland Kitchen DISCONTD: dicyclomine (BENTYL) 20 MG tablet Take 1 tablet (20 mg total) by mouth 2 (two) times daily.  60 tablet  2  . DISCONTD: esomeprazole (NEXIUM) 40 MG capsule Take 40 mg by mouth daily before breakfast.      . DISCONTD: dexlansoprazole (DEXILANT) 60 MG capsule Take 60 mg by mouth daily.          No orders of the defined types were placed in this encounter.    No Follow-up on file.

## 2011-11-12 LAB — COMPLETE METABOLIC PANEL WITH GFR
ALT: 13 U/L (ref 0–35)
CO2: 22 mEq/L (ref 19–32)
Calcium: 9.3 mg/dL (ref 8.4–10.5)
Chloride: 107 mEq/L (ref 96–112)
Creat: 0.77 mg/dL (ref 0.50–1.10)
GFR, Est African American: >89 mL/min
GFR, Est Non African American: 85 mL/min
Glucose, Bld: 86 mg/dL (ref 70–99)
Total Protein: 6.4 g/dL (ref 6.0–8.3)

## 2011-11-13 ENCOUNTER — Encounter: Payer: Self-pay | Admitting: Internal Medicine

## 2011-11-13 DIAGNOSIS — Z0000 Encounter for general adult medical examination without abnormal findings: Secondary | ICD-10-CM | POA: Insufficient documentation

## 2011-11-13 NOTE — Assessment & Plan Note (Signed)
Improved with use of Bentyl. I have also recommended a high-fiber diet. She may use the Bentyl up to 3-4 times daily. Continue to use Mylanta gas as needed for gaseous distention.

## 2011-11-13 NOTE — Assessment & Plan Note (Signed)
A comprehensive none GYN exam was done today. Her Pap smears and mammograms are managed by her gynecologist at [facility].

## 2011-12-05 NOTE — Telephone Encounter (Signed)
I have shredded the patients GI records since she has not call back and not picked them up since we last spoke in April and she has already seen her PCP and there is no mention of another GI referral,

## 2012-01-03 ENCOUNTER — Other Ambulatory Visit: Payer: Self-pay | Admitting: *Deleted

## 2012-01-03 ENCOUNTER — Encounter: Payer: Self-pay | Admitting: Internal Medicine

## 2012-01-03 MED ORDER — ALPRAZOLAM 0.5 MG PO TABS: 0.2500 mg | ORAL_TABLET | Freq: Two times a day (BID) | ORAL | Status: DC | PRN

## 2012-01-04 NOTE — Telephone Encounter (Signed)
Rx called to [facility]. 

## 2012-04-09 ENCOUNTER — Telehealth: Payer: Self-pay | Admitting: Internal Medicine

## 2012-04-09 MED ORDER — ONDANSETRON HCL 8 MG PO TABS: 8.0000 mg | ORAL_TABLET | Freq: Three times a day (TID) | ORAL | Status: DC | PRN

## 2012-04-09 NOTE — Telephone Encounter (Signed)
rx sent in 

## 2012-04-09 NOTE — Telephone Encounter (Signed)
Refill request for ondansetron 8 mg tab Sig: take 1 tablet 2-3 times daily as needed for nausea

## 2012-04-09 NOTE — Telephone Encounter (Signed)
Done

## 2012-06-06 ENCOUNTER — Other Ambulatory Visit: Payer: Self-pay | Admitting: Internal Medicine

## 2012-06-07 ENCOUNTER — Other Ambulatory Visit: Payer: Self-pay

## 2012-06-07 NOTE — Telephone Encounter (Signed)
Patient request refill for Adderall . She stated that she is only getting 5 mg and have to take it 2 times daily, she request either 10 mg so she can break it in half or 60 tablets.

## 2012-06-08 ENCOUNTER — Other Ambulatory Visit: Payer: Self-pay | Admitting: Internal Medicine

## 2012-06-08 MED ORDER — AMPHETAMINE-DEXTROAMPHETAMINE 5 MG PO TABS: 5.0000 mg | ORAL_TABLET | ORAL | Status: DC

## 2012-06-08 NOTE — Telephone Encounter (Signed)
Pt came by and pick up rx for adderall.

## 2012-06-11 ENCOUNTER — Telehealth: Payer: Self-pay | Admitting: Internal Medicine

## 2012-06-11 NOTE — Telephone Encounter (Signed)
[phone] is best number to contact patient, she states she needs a prior auth for her aderall has this been taken care of?

## 2012-06-15 NOTE — Telephone Encounter (Signed)
Just received form today it is in the green folder, patient wants to be notified once form is faxed.

## 2012-06-18 NOTE — Telephone Encounter (Signed)
Form is in green folder for provider to sign.

## 2012-06-18 NOTE — Telephone Encounter (Signed)
Patient called to remind you to send in her prior authorization for her Adderall .

## 2012-07-17 ENCOUNTER — Other Ambulatory Visit: Payer: Self-pay | Admitting: Internal Medicine

## 2012-07-17 MED ORDER — DICYCLOMINE HCL 20 MG PO TABS: 20.0000 mg | ORAL_TABLET | Freq: Two times a day (BID) | ORAL | Status: DC

## 2012-07-17 NOTE — Telephone Encounter (Signed)
dicyclomine (BENTYL) 20 MG tablet  # 60

## 2012-07-17 NOTE — Telephone Encounter (Signed)
Med filled.  

## 2012-07-18 ENCOUNTER — Other Ambulatory Visit: Payer: Self-pay | Admitting: General Practice

## 2012-07-18 ENCOUNTER — Other Ambulatory Visit: Payer: Self-pay | Admitting: Internal Medicine

## 2012-07-18 MED ORDER — DICYCLOMINE HCL 20 MG PO TABS: 20.0000 mg | ORAL_TABLET | Freq: Two times a day (BID) | ORAL | Status: DC

## 2012-07-18 MED ORDER — ALPRAZOLAM 0.5 MG PO TABS: 0.2500 mg | ORAL_TABLET | Freq: Two times a day (BID) | ORAL | Status: DC | PRN

## 2012-07-18 NOTE — Telephone Encounter (Signed)
Med resent. Xanax Rx placed on [facility] desk for sig.

## 2012-07-18 NOTE — Telephone Encounter (Signed)
Patient called [facility] and they do not have her Bentyl that was okay'd on 1.14.14 . She also wants her prescription ready for ALPRAZolam Prudy Feeler) 0.5 MG tablet    on 1.17.14 Fri to be picked up.

## 2012-07-20 ENCOUNTER — Telehealth: Payer: Self-pay | Admitting: Internal Medicine

## 2012-07-20 MED ORDER — AMPHETAMINE-DEXTROAMPHETAMINE 5 MG PO TABS: 5.0000 mg | ORAL_TABLET | ORAL | Status: DC

## 2012-07-20 NOTE — Telephone Encounter (Signed)
Pt last seen 11/11/11. Med last filled 06/08/12 #60 ok to refill?

## 2012-07-20 NOTE — Telephone Encounter (Signed)
amphetamine-dextroamphetamine (ADDERALL) 5 MG tablet  # 60

## 2012-07-20 NOTE — Telephone Encounter (Signed)
Ok to refill,  Authorized in epic and printed  

## 2012-07-20 NOTE — Telephone Encounter (Signed)
Patient needing the prescription today she is almost out.

## 2012-08-18 ENCOUNTER — Other Ambulatory Visit: Payer: Self-pay

## 2012-08-24 ENCOUNTER — Telehealth: Payer: Self-pay | Admitting: Internal Medicine

## 2012-08-24 NOTE — Telephone Encounter (Signed)
amphetamine-dextroamphetamine (ADDERALL) 5 MG tablet

## 2012-08-25 MED ORDER — AMPHETAMINE-DEXTROAMPHETAMINE 5 MG PO TABS: 5.0000 mg | ORAL_TABLET | ORAL | Status: DC

## 2012-08-25 NOTE — Telephone Encounter (Signed)
Ok to refill,   Fax printed rx to pharmacy once signed   

## 2012-08-31 NOTE — Telephone Encounter (Signed)
Pt notified Rx is ready for pick up at the front desk

## 2012-10-17 ENCOUNTER — Encounter: Payer: Self-pay | Admitting: Internal Medicine

## 2012-10-17 ENCOUNTER — Ambulatory Visit (INDEPENDENT_AMBULATORY_CARE_PROVIDER_SITE_OTHER): Payer: BC Managed Care – PPO | Admitting: Internal Medicine

## 2012-10-17 VITALS — BP 112/72 | HR 83 | Temp 97.9°F | Resp 16 | Wt 174.2 lb

## 2012-10-17 DIAGNOSIS — K2990 Gastroduodenitis, unspecified, without bleeding: Secondary | ICD-10-CM

## 2012-10-17 DIAGNOSIS — K2970 Gastritis, unspecified, without bleeding: Secondary | ICD-10-CM

## 2012-10-17 DIAGNOSIS — K589 Irritable bowel syndrome without diarrhea: Secondary | ICD-10-CM

## 2012-10-17 DIAGNOSIS — Z79899 Other long term (current) drug therapy: Secondary | ICD-10-CM

## 2012-10-17 MED ORDER — AMPHETAMINE-DEXTROAMPHETAMINE 5 MG PO TABS: 5.0000 mg | ORAL_TABLET | ORAL | Status: DC

## 2012-10-17 MED ORDER — MELOXICAM 15 MG PO TABS: 15.0000 mg | ORAL_TABLET | Freq: Every day | ORAL | Status: DC | PRN

## 2012-10-17 MED ORDER — DEXLANSOPRAZOLE 60 MG PO CPDR: 60.0000 mg | DELAYED_RELEASE_CAPSULE | Freq: Every day | ORAL | Status: DC

## 2012-10-17 NOTE — Progress Notes (Signed)
Patient ID: Brandi Douglas, female   DOB: 02-Jan-1951, 62 y.o.   MRN: [id number]    Patient Active Problem List  Diagnosis  . Chronic fatigue syndrome  . Generalized anxiety disorder  . Irritable bowel syndrome (IBS)  . Headache, migraine  . Irritable bowel syndrome  . Routine general medical examination at a health care facility    Subjective:  CC:   Chief Complaint  Patient presents with  . Acute Visit    Nausea, burning, pain in upper gastric    HPI:   Brandi Douglas a 62 y.o. female who presents for evaluation ndn treatment of abdominal pain.  Patient has a history of IBS with recurrent episodes of gastritis managed with Nexium. She has been taking her Nexium as directed but has been having recurrent episodes of burning gastric pain which are occurring after eating. Her symptoms were better controlled on Dexilant but she had to switch to Nexium because of insurance issues. She denies any change in bowel habits currently. She alternates between diarrhea and constipation. She has noted increased bloating at times. She hs nausea without vomiting or unintentional weight loss.   Past Medical History  Diagnosis Date  . Chronic fatigue syndrome   . Generalized anxiety disorder   . Irritable bowel syndrome     Past Surgical History  Procedure Laterality Date  . Cholecystectomy         The following portions of the patient's history were reviewed and updated as appropriate: Allergies, current medications, and problem list.    Review of Systems:   Patient denies headache, fevers, malaise, unintentional weight loss, skin rash, eye pain, sinus congestion and sinus pain, sore throat, dysphagia,  hemoptysis , cough, dyspnea, wheezing, chest pain, palpitations, orthopnea, edema, abdominal pain, nausea, melena, diarrhea, constipation, flank pain, dysuria, hematuria, urinary  Frequency, nocturia, numbness, tingling, seizures,  Focal weakness, Loss of consciousness,  Tremor, insomnia,  depression, anxiety, and suicidal ideation.         History   Social History  . Marital Status: Married    Spouse Name: Renae Fickle     Number of Children: N/A  . Years of Education: N/A   Occupational History  . dental hygienist    Social History Main Topics  . Smoking status: Never Smoker   . Smokeless tobacco: Never Used  . Alcohol Use: No  . Drug Use: No  . Sexually Active: Yes -- Female partner(s)   Other Topics Concern  . Not on file   Social History Narrative  . No narrative on file    Objective:  BP 112/72  Pulse 83  Temp(Src) 97.9 F (36.6 C) (Oral)  Resp 16  Wt 174 lb 4 oz (79.039 kg)  BMI 28.14 kg/m2  SpO2 99%  General appearance: alert, cooperative and appears stated age Ears: normal TM's and external ear canals both ears Throat: lips, mucosa, and tongue normal; teeth and gums normal Neck: no adenopathy, no carotid bruit, supple, symmetrical, trachea midline and thyroid not enlarged, symmetric, no tenderness/mass/nodules Back: symmetric, no curvature. ROM normal. No CVA tenderness. Lungs: clear to auscultation bilaterally Heart: regular rate and rhythm, S1, S2 normal, no murmur, click, rub or gallop Abdomen: soft, non-tender; bowel sounds normal; no masses,  no organomegaly Pulses: 2+ and symmetric Skin: Skin color, texture, turgor normal. No rashes or lesions Lymph nodes: Cervical, supraclavicular, and axillary nodes normal.  Assessment and Plan:  Irritable bowel syndrome (IBS) With recurrent episodes of non-endoscopy proven gastritis. Her current symptoms have returned after having  to switch from Dexilant to Nexium. Samples of Dexilant given along with the co-pay card. H. pylori antibody to be checked, CMET normal.. Continue Bentyl which seems to prevent her from having constipation. Given her recurrent episodes of abdominal bloating,  we will discuss a trial of Donnatal at next visit.   Updated Medication List Outpatient Encounter Prescriptions as  of 10/17/2012  Medication Sig Dispense Refill  . ALPRAZolam (XANAX) 0.5 MG tablet Take 0.5-1 tablets (0.25-0.5 mg total) by mouth 2 (two) times daily as needed. For anxiety  30 tablet  5  . amphetamine-dextroamphetamine (ADDERALL) 5 MG tablet Take 1 tablet (5 mg total) by mouth See admin instructions. Pt takes 1 tab in the morning & may take 1 additional tab in the evening if needed  60 tablet  0  . dicyclomine (BENTYL) 20 MG tablet Take 1 tablet (20 mg total) by mouth 2 (two) times daily.  60 tablet  5  . escitalopram (LEXAPRO) 20 MG tablet Take 1 tablet (20 mg total) by mouth daily.  30 tablet  3  . esomeprazole (NEXIUM) 40 MG capsule Take 1 capsule (40 mg total) by mouth daily before breakfast.  30 capsule  11  . meloxicam (MOBIC) 15 MG tablet Take 1 tablet (15 mg total) by mouth daily as needed. For pain  30 tablet  3  . Multiple Vitamin (MULTIVITAMIN) tablet Take 1 tablet by mouth daily.      . ondansetron (ZOFRAN) 8 MG tablet Take 1 tablet (8 mg total) by mouth every 8 (eight) hours as needed. For nausea  20 tablet  1  . SUMAtriptan (IMITREX) 100 MG tablet Take 100 mg by mouth every 2 (two) hours as needed. For migraines      . tolterodine (DETROL LA) 4 MG 24 hr capsule Take 4 mg by mouth daily.      . [DISCONTINUED] amphetamine-dextroamphetamine (ADDERALL) 5 MG tablet Take 1 tablet (5 mg total) by mouth See admin instructions. Pt takes 1 tab in the morning & may take 1 additional tab in the evening if needed  60 tablet  0  . [DISCONTINUED] meloxicam (MOBIC) 15 MG tablet Take 15 mg by mouth daily as needed. For pain      . cholecalciferol (VITAMIN D) 1000 UNITS tablet Take 1,000 Units by mouth daily.      Marland Kitchen dexlansoprazole (DEXILANT) 60 MG capsule Take 1 capsule (60 mg total) by mouth daily.  30 capsule  3  . Probiotic Product (ALIGN PO) Take 1 tablet by mouth daily.        No facility-administered encounter medications on file as of 10/17/2012.     Orders Placed This Encounter  Procedures   . H Pylori, IGM, IGG, IGA AB  . Comprehensive metabolic panel    No Follow-up on file.

## 2012-10-17 NOTE — Patient Instructions (Addendum)
We will switch you from nexium to Dexilant to see if the gastritis imporves  You can use Mylanta Gas as needed for immediate relief of burning

## 2012-10-18 LAB — COMPREHENSIVE METABOLIC PANEL
ALT: 15 U/L (ref 0–35)
AST: 20 U/L (ref 0–37)
Alkaline Phosphatase: 68 U/L (ref 39–117)
Creatinine, Ser: 0.8 mg/dL (ref 0.4–1.2)
Sodium: 141 mEq/L (ref 135–145)
Total Bilirubin: 0.5 mg/dL (ref 0.3–1.2)

## 2012-10-19 ENCOUNTER — Encounter: Payer: Self-pay | Admitting: Internal Medicine

## 2012-10-19 NOTE — Assessment & Plan Note (Addendum)
With recurrent episodes of non-endoscopy proven gastritis. Her current symptoms have returned after having to switch from Dexilant to Nexium. Samples of Dexilant given along with the co-pay card. H. pylori antibody to be checked. Lipase and CMET normal.. Continue Bentyl which seems to prevent her from having constipation. Given her recurrent episodes of abdominal bloating,  we will discuss a trial of Donnatal at next visit.

## 2012-10-20 ENCOUNTER — Encounter: Payer: Self-pay | Admitting: Internal Medicine

## 2012-10-20 LAB — H PYLORI, IGM, IGG, IGA AB
H. pylori, IgA Abs: <9 units (ref 0.0–8.9)
H. pylori, IgM Abs: <9 units (ref 0.0–8.9)

## 2012-10-22 ENCOUNTER — Encounter: Payer: Self-pay | Admitting: Internal Medicine

## 2012-10-23 ENCOUNTER — Other Ambulatory Visit: Payer: Self-pay | Admitting: *Deleted

## 2012-10-23 NOTE — Telephone Encounter (Signed)
Patient called requesting a refill on her Lexapro. Patient states that she called her pharmacy yesterday and they have not gotten the refill from the office yet, no record of receiving request. . Is it okay to refill medication?

## 2012-10-24 ENCOUNTER — Other Ambulatory Visit: Payer: Self-pay | Admitting: *Deleted

## 2012-10-24 MED ORDER — ESCITALOPRAM OXALATE 20 MG PO TABS: 20.0000 mg | ORAL_TABLET | Freq: Every day | ORAL | Status: DC

## 2012-10-24 NOTE — Telephone Encounter (Signed)
Rx sent to pharmacy by escript  

## 2012-10-24 NOTE — Telephone Encounter (Signed)
It has been done.

## 2012-12-04 ENCOUNTER — Other Ambulatory Visit: Payer: Self-pay | Admitting: *Deleted

## 2012-12-04 NOTE — Telephone Encounter (Signed)
Fax received from pharmacy, pt requesting to change back to Nexium due to "having side effects, 'bad dreams'" on Dexilant. Ok?

## 2012-12-12 NOTE — Telephone Encounter (Signed)
Patient is waiting on response to get her nexium prescription.

## 2012-12-13 ENCOUNTER — Encounter: Payer: Self-pay | Admitting: Internal Medicine

## 2012-12-13 MED ORDER — ESOMEPRAZOLE MAGNESIUM 40 MG PO CPDR: 40.0000 mg | DELAYED_RELEASE_CAPSULE | Freq: Every day | ORAL | Status: DC

## 2013-01-06 ENCOUNTER — Other Ambulatory Visit: Payer: Self-pay | Admitting: Internal Medicine

## 2013-01-06 ENCOUNTER — Encounter: Payer: Self-pay | Admitting: Internal Medicine

## 2013-01-08 NOTE — Telephone Encounter (Signed)
Pt requested refill thru myChart. Last OV 4/14 was acute visit, prior to that 5/13?

## 2013-01-10 MED ORDER — AMPHETAMINE-DEXTROAMPHETAMINE 5 MG PO TABS: 5.0000 mg | ORAL_TABLET | ORAL | Status: DC

## 2013-01-11 ENCOUNTER — Telehealth: Payer: Self-pay | Admitting: Internal Medicine

## 2013-01-11 NOTE — Telephone Encounter (Signed)
Script placed up front for patient puck up.

## 2013-01-11 NOTE — Telephone Encounter (Signed)
Patient would like to come by today and pick up her adderal please call once it is ready. She would like to pick up today.

## 2013-01-14 NOTE — Telephone Encounter (Signed)
Patient has picked up script

## 2013-01-18 ENCOUNTER — Ambulatory Visit (INDEPENDENT_AMBULATORY_CARE_PROVIDER_SITE_OTHER): Payer: BC Managed Care – PPO | Admitting: Internal Medicine

## 2013-01-18 ENCOUNTER — Encounter: Payer: Self-pay | Admitting: Internal Medicine

## 2013-01-18 VITALS — BP 126/84 | HR 112 | Temp 98.5°F | Resp 14 | Ht 65.0 in | Wt 169.0 lb

## 2013-01-18 DIAGNOSIS — K589 Irritable bowel syndrome without diarrhea: Secondary | ICD-10-CM

## 2013-01-18 DIAGNOSIS — Z23 Encounter for immunization: Secondary | ICD-10-CM

## 2013-01-18 DIAGNOSIS — R5381 Other malaise: Secondary | ICD-10-CM

## 2013-01-18 DIAGNOSIS — Z0000 Encounter for general adult medical examination without abnormal findings: Secondary | ICD-10-CM

## 2013-01-18 DIAGNOSIS — R5382 Chronic fatigue, unspecified: Secondary | ICD-10-CM

## 2013-01-18 DIAGNOSIS — Z1239 Encounter for other screening for malignant neoplasm of breast: Secondary | ICD-10-CM

## 2013-01-18 DIAGNOSIS — G9332 Myalgic encephalomyelitis/chronic fatigue syndrome: Secondary | ICD-10-CM

## 2013-01-18 DIAGNOSIS — E559 Vitamin D deficiency, unspecified: Secondary | ICD-10-CM

## 2013-01-18 DIAGNOSIS — R252 Cramp and spasm: Secondary | ICD-10-CM

## 2013-01-18 DIAGNOSIS — R5383 Other fatigue: Secondary | ICD-10-CM

## 2013-01-18 LAB — COMPREHENSIVE METABOLIC PANEL
ALT: 16 U/L (ref 0–35)
BUN: 12 mg/dL (ref 6–23)
CO2: 25 mEq/L (ref 19–32)
Calcium: 9.8 mg/dL (ref 8.4–10.5)
Chloride: 105 mEq/L (ref 96–112)
Creatinine, Ser: 0.9 mg/dL (ref 0.4–1.2)
GFR: 71.39 mL/min (ref >60.00)
Glucose, Bld: 95 mg/dL (ref 70–99)

## 2013-01-18 LAB — CBC WITH DIFFERENTIAL/PLATELET
Eosinophils Relative: 1.1 % (ref 0.0–5.0)
HCT: 37.9 % (ref 36.0–46.0)
Hemoglobin: 12.9 g/dL (ref 12.0–15.0)
Lymphs Abs: 1.2 10*3/uL (ref 0.7–4.0)
Monocytes Relative: 8.6 % (ref 3.0–12.0)
Platelets: 165 10*3/uL (ref 150.0–400.0)
RBC: 4.03 Mil/uL (ref 3.87–5.11)
WBC: 4.4 10*3/uL — ABNORMAL LOW (ref 4.5–10.5)

## 2013-01-18 LAB — TSH: TSH: 1.31 u[IU]/mL (ref 0.35–5.50)

## 2013-01-18 LAB — VITAMIN B12: Vitamin B-12: 405 pg/mL (ref 211–911)

## 2013-01-18 MED ORDER — AMPHETAMINE-DEXTROAMPHETAMINE 5 MG PO TABS: 5.0000 mg | ORAL_TABLET | ORAL | Status: DC

## 2013-01-18 MED ORDER — HYOSCYAMINE SULFATE 0.125 MG SL SUBL: 0.1250 mg | SUBLINGUAL_TABLET | SUBLINGUAL | Status: DC | PRN

## 2013-01-18 MED ORDER — TROSPIUM CHLORIDE 20 MG PO TABS: 20.0000 mg | ORAL_TABLET | Freq: Two times a day (BID) | ORAL | Status: DC

## 2013-01-18 NOTE — Patient Instructions (Addendum)
You had your annual  wellness exam today  We will schedule your mammogram soon.   I recommend that  a TDaP vaccine and a Shingles vaccine.     We will contact you with the bloodwork results

## 2013-01-18 NOTE — Progress Notes (Deleted)
Patient ID: Brandi Douglas, female   DOB: 1950/07/26, 62 y.o.   MRN: [id number]   Subjective:     Brandi Douglas is a 62 y.o. female and is here for a comprehensive physical exam. The patient reports {problems:16946}.  History   Social History  . Marital Status: Married    Spouse Name: Renae Fickle     Number of Children: N/A  . Years of Education: N/A   Occupational History  . dental hygienist    Social History Main Topics  . Smoking status: Never Smoker   . Smokeless tobacco: Never Used  . Alcohol Use: No  . Drug Use: No  . Sexually Active: Yes -- Female partner(s)   Other Topics Concern  . Not on file   Social History Narrative  . No narrative on file   Health Maintenance  Topic Date Due  . Zostavax  06/11/2012  . Influenza Vaccine  03/04/2013  . Mammogram  03/11/2013  . Pap Smear  09/08/2013  . Colonoscopy  07/12/2019  . Tetanus/tdap  01/19/2023    {Common ambulatory SmartLinks:19316}  Review of Systems {ros; complete:30496}   Objective:

## 2013-01-19 LAB — VITAMIN D 25 HYDROXY (VIT D DEFICIENCY, FRACTURES): Vit D, 25-Hydroxy: 33 ng/mL (ref 30–89)

## 2013-01-20 NOTE — Assessment & Plan Note (Signed)
Is managed with Nexium, Bentyl, and restricted diet. She has resumed Nexium and will increase her Bentyl to 3-4 times daily as needed for bloating and cramping. Refill on hyoscyamine as well.

## 2013-01-20 NOTE — Progress Notes (Signed)
Subjective:     Brandi Douglas is a 62 y.o. female and is here for a comprehensive physical exam. The patient reports no new problems. She has had her annual pelvic exam and her PAP smear was normal in 2012. . She saw Dr. Isa Rankin for her annual ermatologic exam in May which  was normal. 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 She has been using dicyclomine once or twice daily and wonders if she can add an evening dose. Normal colonoscopy 2011.  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    History   Social History  . Marital Status: Married    Spouse Name: Renae Fickle     Number of Children: N/A  . Years of Education: N/A   Occupational History  . dental hygienist    Social History Main Topics  . Smoking status: Never Smoker   . Smokeless tobacco: Never Used  . Alcohol Use: No  . Drug Use: No  . Sexually Active: Yes -- Female partner(s)   Other Topics Concern  . Not on file   Social History Narrative  . No narrative on file   Health Maintenance  Topic Date Due  . Zostavax  06/11/2012  . Influenza Vaccine  03/04/2013  . Mammogram  03/11/2013  . Pap Smear  09/08/2013  . Colonoscopy  07/12/2019  . Tetanus/tdap  01/19/2023    The following portions of the patient's history were reviewed and updated as appropriate: allergies, current medications, past family history, past medical history, past social history, past surgical history and problem list.  Review of Systems A comprehensive review of systems was negative.   Objective:  BP 126/84  Pulse 112  Temp(Src) 98.5 F (36.9 C) (Oral)  Resp 14  Ht 5\' 5"  (1.651 m)  Wt 169 lb (76.658 kg)  BMI 28.12 kg/m2  SpO2 98% General appearance: alert, cooperative and appears stated age Ears: normal TM's and external ear canals both ears Throat: lips, mucosa, and tongue normal; teeth and gums normal Neck:  no adenopathy, no carotid bruit, supple, symmetrical, trachea midline and thyroid not enlarged, symmetric, no tenderness/mass/nodules Back: symmetric, no curvature. ROM normal. No CVA tenderness. Lungs: clear to auscultation bilaterally Heart: regular rate and rhythm, S1, S2 normal, no murmur, click, rub or gallop Abdomen: soft, non-tender; bowel sounds normal; no masses,  no organomegaly Pulses: 2+ and symmetric Skin: Skin color, texture, turgor normal. No rashes or lesions Lymph nodes: Cervical, supraclavicular, and axillary nodes normal.  Assessment:      Irritable bowel syndrome (IBS) Is managed with Nexium, Bentyl, and restricted diet. She has resumed Nexium and will increase her Bentyl to 3-4 times daily as needed for bloating and cramping. Refill on hyoscyamine as well.  Chronic fatigue syndrome Managed with daily stimulant   Routine general medical examination at a health care facility Annual comprehensive exam was done excluding breast, pelvic and PAP smear. All screenings are up to date and have been reviewed.Marland Kitchen    Updated Medication List Outpatient Encounter Prescriptions as of 01/18/2013  Medication Sig Dispense Refill  . ALPRAZolam (XANAX) 0.5 MG tablet Take 0.5-1 tablets (0.25-0.5 mg total) by mouth 2 (two) times daily as needed. For anxiety  30 tablet  5  . amphetamine-dextroamphetamine (ADDERALL) 5 MG tablet Take 1 tablet (5 mg total) by mouth See admin instructions. Pt takes 1 tab in the morning & may take 1  additional tab in the evening if needed  60 tablet  0  . amphetamine-dextroamphetamine (ADDERALL) 5 MG tablet Take 1 tablet (5 mg total) by mouth See admin instructions. Pt takes 1 tab in the morning & may take 1 additional tab in the evening if needed  60 tablet  0  . amphetamine-dextroamphetamine (ADDERALL) 5 MG tablet Take 1 tablet (5 mg total) by mouth See admin instructions. Pt takes 1 tab in the morning & may take 1 additional tab in the evening if needed  60  tablet  0  . dicyclomine (BENTYL) 20 MG tablet Take 1 tablet (20 mg total) by mouth 2 (two) times daily.  60 tablet  5  . escitalopram (LEXAPRO) 20 MG tablet Take 1 tablet (20 mg total) by mouth daily.  30 tablet  2  . esomeprazole (NEXIUM) 40 MG capsule Take 1 capsule (40 mg total) by mouth daily before breakfast.  30 capsule  5  . meloxicam (MOBIC) 15 MG tablet Take 1 tablet (15 mg total) by mouth daily as needed. For pain  30 tablet  3  . Multiple Vitamin (MULTIVITAMIN) tablet Take 1 tablet by mouth daily.      . ondansetron (ZOFRAN) 8 MG tablet Take 1 tablet (8 mg total) by mouth every 8 (eight) hours as needed. For nausea  20 tablet  1  . SUMAtriptan (IMITREX) 100 MG tablet Take 100 mg by mouth every 2 (two) hours as needed. For migraines      . tolterodine (DETROL LA) 4 MG 24 hr capsule Take 4 mg by mouth daily.      . [DISCONTINUED] amphetamine-dextroamphetamine (ADDERALL) 5 MG tablet Take 1 tablet (5 mg total) by mouth See admin instructions. Pt takes 1 tab in the morning & may take 1 additional tab in the evening if needed  60 tablet  0  . [DISCONTINUED] amphetamine-dextroamphetamine (ADDERALL) 5 MG tablet Take 1 tablet (5 mg total) by mouth See admin instructions. Pt takes 1 tab in the morning & may take 1 additional tab in the evening if needed  60 tablet  0  . [DISCONTINUED] amphetamine-dextroamphetamine (ADDERALL) 5 MG tablet Take 1 tablet (5 mg total) by mouth See admin instructions. Pt takes 1 tab in the morning & may take 1 additional tab in the evening if needed  60 tablet  0  . cholecalciferol (VITAMIN D) 1000 UNITS tablet Take 1,000 Units by mouth daily.      . hyoscyamine (LEVSIN SL) 0.125 MG SL tablet Place 1 tablet (0.125 mg total) under the tongue every 4 (four) hours as needed for cramping.  30 tablet  3  . Probiotic Product (ALIGN PO) Take 1 tablet by mouth daily.       . trospium (SANCTURA) 20 MG tablet Take 1 tablet (20 mg total) by mouth 2 (two) times daily.  60 tablet  1   . [DISCONTINUED] dexlansoprazole (DEXILANT) 60 MG capsule Take 1 capsule (60 mg total) by mouth daily.  30 capsule  3   No facility-administered encounter medications on file as of 01/18/2013.

## 2013-01-20 NOTE — Assessment & Plan Note (Signed)
Annual comprehensive exam was done excluding breast, pelvic and PAP smear. All screenings are up to date and have been reviewed.Marland Kitchen

## 2013-01-20 NOTE — Assessment & Plan Note (Signed)
Managed with daily stimulant

## 2013-01-21 LAB — FOLATE RBC: RBC Folate: 1158 ng/mL (ref >=366)

## 2013-02-13 ENCOUNTER — Telehealth: Payer: Self-pay | Admitting: Internal Medicine

## 2013-02-13 NOTE — Telephone Encounter (Signed)
FYI the patient has referred herself to Dr. Alphonzo Dublin for bloating and acid reflux . Is it ok to fax over notes for this patient.

## 2013-02-15 ENCOUNTER — Encounter: Payer: Self-pay | Admitting: Internal Medicine

## 2013-02-15 DIAGNOSIS — K219 Gastro-esophageal reflux disease without esophagitis: Secondary | ICD-10-CM

## 2013-02-18 ENCOUNTER — Other Ambulatory Visit: Payer: Self-pay | Admitting: *Deleted

## 2013-02-18 NOTE — Telephone Encounter (Signed)
yes

## 2013-02-18 NOTE — Telephone Encounter (Signed)
Patient would like to see Dr. Alphonzo Dublin for a consultation concerning treatment for acid reflux. " I have signed a consent form to have my latest records faxed to his office. I was referred by one of my dental patients who was treated by him successfully. Dr Mikel Cella is a thoracic surgeon at [facility]. Thanks. Brandi Douglas. "

## 2013-02-20 ENCOUNTER — Encounter: Payer: Self-pay | Admitting: Internal Medicine

## 2013-02-20 MED ORDER — ESCITALOPRAM OXALATE 20 MG PO TABS: 20.0000 mg | ORAL_TABLET | Freq: Every day | ORAL | Status: DC

## 2013-03-25 ENCOUNTER — Other Ambulatory Visit: Payer: Self-pay | Admitting: *Deleted

## 2013-03-25 MED ORDER — DICYCLOMINE HCL 20 MG PO TABS: 20.0000 mg | ORAL_TABLET | Freq: Two times a day (BID) | ORAL | Status: DC

## 2013-03-28 ENCOUNTER — Other Ambulatory Visit: Payer: Self-pay | Admitting: *Deleted

## 2013-04-05 ENCOUNTER — Other Ambulatory Visit: Payer: Self-pay | Admitting: Internal Medicine

## 2013-04-05 NOTE — Telephone Encounter (Signed)
States she is at drug store to pick up sumatriptan but [facility] does not have it ready for her.  States the drug store has sent several requests for this and has not gotten a response.  Pt statew she is completely out.  Pt states pharmacy sent again while she was standing there.

## 2013-04-05 NOTE — Telephone Encounter (Signed)
Please advise as to refill no request from pharmacy noted for this medication.

## 2013-04-06 MED ORDER — SUMATRIPTAN SUCCINATE 100 MG PO TABS: 100.0000 mg | ORAL_TABLET | ORAL | Status: DC | PRN

## 2013-04-08 ENCOUNTER — Encounter: Payer: Self-pay | Admitting: Emergency Medicine

## 2013-04-08 ENCOUNTER — Encounter: Payer: Self-pay | Admitting: Internal Medicine

## 2013-04-08 MED ORDER — LEXAPRO 20 MG PO TABS: 20.0000 mg | ORAL_TABLET | Freq: Every day | ORAL | Status: DC

## 2013-04-08 MED ORDER — SUMATRIPTAN SUCCINATE 100 MG PO TABS: 100.0000 mg | ORAL_TABLET | ORAL | Status: DC | PRN

## 2013-04-09 ENCOUNTER — Encounter: Payer: Self-pay | Admitting: Internal Medicine

## 2013-05-03 LAB — HM MAMMOGRAPHY

## 2013-05-09 ENCOUNTER — Other Ambulatory Visit: Payer: Self-pay

## 2013-05-13 ENCOUNTER — Encounter: Payer: Self-pay | Admitting: *Deleted

## 2013-05-13 ENCOUNTER — Encounter: Payer: Self-pay | Admitting: Internal Medicine

## 2013-05-17 ENCOUNTER — Telehealth: Payer: Self-pay | Admitting: *Deleted

## 2013-05-17 ENCOUNTER — Encounter: Payer: Self-pay | Admitting: Internal Medicine

## 2013-05-17 MED ORDER — LEXAPRO 20 MG PO TABS: 20.0000 mg | ORAL_TABLET | Freq: Every day | ORAL | Status: DC

## 2013-05-17 NOTE — Telephone Encounter (Signed)
Pharmacy called and stated they had sent fax for refill on Lexapro may I refill last OV 7/14

## 2013-05-17 NOTE — Telephone Encounter (Signed)
Yes i sent it

## 2013-05-21 ENCOUNTER — Encounter: Payer: Self-pay | Admitting: *Deleted

## 2013-05-22 ENCOUNTER — Ambulatory Visit (INDEPENDENT_AMBULATORY_CARE_PROVIDER_SITE_OTHER): Payer: BC Managed Care – PPO | Admitting: Internal Medicine

## 2013-05-22 ENCOUNTER — Encounter: Payer: Self-pay | Admitting: Internal Medicine

## 2013-05-22 VITALS — BP 138/82 | HR 104 | Temp 98.5°F | Resp 12 | Wt 171.8 lb

## 2013-05-22 DIAGNOSIS — F411 Generalized anxiety disorder: Secondary | ICD-10-CM

## 2013-05-22 DIAGNOSIS — G9332 Myalgic encephalomyelitis/chronic fatigue syndrome: Secondary | ICD-10-CM

## 2013-05-22 DIAGNOSIS — R5382 Chronic fatigue, unspecified: Secondary | ICD-10-CM

## 2013-05-22 MED ORDER — PAROXETINE HCL 20 MG PO TABS: 20.0000 mg | ORAL_TABLET | Freq: Every day | ORAL | Status: DC

## 2013-05-22 MED ORDER — PAXIL 20 MG PO TABS: 20.0000 mg | ORAL_TABLET | Freq: Every day | ORAL | Status: DC

## 2013-05-22 NOTE — Patient Instructions (Signed)
We will try changing SSRIs to Paxil  Start with 20 mg daily.  We can increase after 2 weeks fo 30 mg daily  Referral to Tamsen Roers in process

## 2013-05-22 NOTE — Progress Notes (Signed)
Patient ID: Brandi Douglas, female   DOB: Aug 25, 1950, 62 y.o.   MRN: [id number]  Patient Active Problem List   Diagnosis Date Noted  . Routine general medical examination at a health care facility 11/13/2011  . Chronic fatigue syndrome 10/10/2011  . Generalized anxiety disorder 10/10/2011  . Irritable bowel syndrome (IBS) 10/10/2011  . Headache, migraine 10/10/2011  . Irritable bowel syndrome     Subjective:  CC:   Chief Complaint  Patient presents with  . Follow-up    medications    HPI:   Brandi Douglas a 62 y.o. female who presents Follow up on chronic conditions including anxiety/depression and chronic fatigue syndrome, diagnosed many years ago by prior physician who has moved to [place].  She is no longer able to obtain brand name lexapro anymore without significant out of pocket expense and has a long list of intolerances to generic medications and other SSRIs.  Continues to need Focalin for chronic fatigue syndrome.  Wanting to know if there are alternatives. Still has days where she comes home from work and goes  directly to bed,  And on weekends has no energy to do anything but stay in bed.  Husband Renae Fickle accompanying her today .     Past Medical History  Diagnosis Date  . Chronic fatigue syndrome   . Generalized anxiety disorder   . Irritable bowel syndrome     Past Surgical History  Procedure Laterality Date  . Cholecystectomy         The following portions of the patient's history were reviewed and updated as appropriate: Allergies, current medications, and problem list.    Review of Systems:   12 Pt  review of systems was negative except those addressed in the HPI,     History   Social History  . Marital Status: Married    Spouse Name: Renae Fickle     Number of Children: N/A  . Years of Education: N/A   Occupational History  . dental hygienist    Social History Main Topics  . Smoking status: Never Smoker   . Smokeless tobacco: Never Used  .  Alcohol Use: No  . Drug Use: No  . Sexual Activity: Yes    Partners: Male   Other Topics Concern  . Not on file   Social History Narrative  . No narrative on file    Objective:  Filed Vitals:   05/22/13 1203  BP: 138/82  Pulse: 104  Temp: 98.5 F (36.9 C)  Resp: 12     General appearance: alert, cooperative and appears stated age Ears: normal TM's and external ear canals both ears Throat: lips, mucosa, and tongue normal; teeth and gums normal Neck: no adenopathy, no carotid bruit, supple, symmetrical, trachea midline and thyroid not enlarged, symmetric, no tenderness/mass/nodules Back: symmetric, no curvature. ROM normal. No CVA tenderness. Lungs: clear to auscultation bilaterally Heart: regular rate and rhythm, S1, S2 normal, no murmur, click, rub or gallop Abdomen: soft, non-tender; bowel sounds normal; no masses,  no organomegaly Pulses: 2+ and symmetric Skin: Skin color, texture, turgor normal. No rashes or lesions Lymph nodes: Cervical, supraclavicular, and axillary nodes normal.  Assessment and Plan:  Chronic fatigue syndrome Continue Focalin for CFS.   Generalized anxiety disorder Discussed current medication and prior trials.  Buspirone nand Paxil suggested as alternatives. And referral to Psychiatry   Trial of paxil. Return in one month     Updated Medication List Outpatient Encounter Prescriptions as of 05/22/2013  Medication Sig  .  ALPRAZolam (XANAX) 0.5 MG tablet Take 0.5-1 tablets (0.25-0.5 mg total) by mouth 2 (two) times daily as needed. For anxiety  . amphetamine-dextroamphetamine (ADDERALL) 5 MG tablet Take 1 tablet (5 mg total) by mouth See admin instructions. Pt takes 1 tab in the morning & may take 1 additional tab in the evening if needed  . cholecalciferol (VITAMIN D) 1000 UNITS tablet Take 1,000 Units by mouth daily.  Marland Kitchen dicyclomine (BENTYL) 20 MG tablet Take 1 tablet (20 mg total) by mouth 2 (two) times daily.  Marland Kitchen escitalopram (LEXAPRO) 20 MG  tablet Take 1 tablet (20 mg total) by mouth daily.  Marland Kitchen esomeprazole (NEXIUM) 40 MG capsule Take 1 capsule (40 mg total) by mouth daily before breakfast.  . hyoscyamine (LEVSIN SL) 0.125 MG SL tablet Place 1 tablet (0.125 mg total) under the tongue every 4 (four) hours as needed for cramping.  . meloxicam (MOBIC) 15 MG tablet Take 1 tablet (15 mg total) by mouth daily as needed. For pain  . Multiple Vitamin (MULTIVITAMIN) tablet Take 1 tablet by mouth daily.  . ondansetron (ZOFRAN) 8 MG tablet Take 1 tablet (8 mg total) by mouth every 8 (eight) hours as needed. For nausea  . Probiotic Product (ALIGN PO) Take 1 tablet by mouth daily.   . SUMAtriptan (IMITREX) 100 MG tablet Take 1 tablet (100 mg total) by mouth every 2 (two) hours as needed. For migraines  . tolterodine (DETROL LA) 4 MG 24 hr capsule Take 4 mg by mouth daily.  Marland Kitchen LEXAPRO 20 MG tablet Take 1 tablet (20 mg total) by mouth daily.  Marland Kitchen PAXIL 20 MG tablet Take 1 tablet (20 mg total) by mouth daily.  . [DISCONTINUED] amphetamine-dextroamphetamine (ADDERALL) 5 MG tablet Take 1 tablet (5 mg total) by mouth See admin instructions. Pt takes 1 tab in the morning & may take 1 additional tab in the evening if needed  . [DISCONTINUED] amphetamine-dextroamphetamine (ADDERALL) 5 MG tablet Take 1 tablet (5 mg total) by mouth See admin instructions. Pt takes 1 tab in the morning & may take 1 additional tab in the evening if needed  . [DISCONTINUED] PARoxetine (PAXIL) 20 MG tablet Take 1 tablet (20 mg total) by mouth daily.  . [DISCONTINUED] PARoxetine (PAXIL) 20 MG tablet Take 1 tablet (20 mg total) by mouth daily.  . [DISCONTINUED] trospium (SANCTURA) 20 MG tablet Take 1 tablet (20 mg total) by mouth 2 (two) times daily.     Orders Placed This Encounter  Procedures  . Ambulatory referral to Psychiatry    No Follow-up on file.

## 2013-05-24 ENCOUNTER — Encounter: Payer: Self-pay | Admitting: Internal Medicine

## 2013-05-24 NOTE — Assessment & Plan Note (Signed)
Continue Focalin for CFS.

## 2013-05-24 NOTE — Assessment & Plan Note (Signed)
Discussed current medication and prior trials.  Buspirone nand Paxil suggested as alternatives. And referral to Psychiatry   Trial of paxil. Return in one month

## 2013-07-08 ENCOUNTER — Encounter: Payer: Self-pay | Admitting: Internal Medicine

## 2013-07-12 NOTE — Telephone Encounter (Signed)
Patient called today and advised she has called [facility] and they are suppose to be faxing a form over to be filled out for patient to receive medication. FYI

## 2013-07-15 ENCOUNTER — Other Ambulatory Visit: Payer: Self-pay | Admitting: Internal Medicine

## 2013-07-15 ENCOUNTER — Encounter: Payer: Self-pay | Admitting: Internal Medicine

## 2013-07-16 ENCOUNTER — Other Ambulatory Visit: Payer: Self-pay | Admitting: Internal Medicine

## 2013-07-16 NOTE — Telephone Encounter (Signed)
Dr. Derrel Nip I have placed a Prior authorization for adderall  that I have called patient insurance and received today in red folder patient is very upset because pharmacy claimed they had already sent prior authorization to [place] ,  but per the conversation with patient s insurance no one has requested this information until they were called by this office I have informed patient prior authorization received and will complete an fax ASAP patient is out of Adderall.

## 2013-08-07 MED ORDER — AMPHETAMINE-DEXTROAMPHETAMINE 5 MG PO TABS: 5.0000 mg | ORAL_TABLET | ORAL | Status: DC

## 2013-08-07 NOTE — Telephone Encounter (Signed)
This is a refill request from mid January that required PA , and it doesn't look like is was refilled in [facility] yet. Can you check with pharmacy? Ok to refill if not done

## 2013-10-06 LAB — HM PAP SMEAR: HM PAP: NORMAL

## 2013-10-25 LAB — HM PAP SMEAR

## 2013-12-30 ENCOUNTER — Encounter: Payer: Self-pay | Admitting: Internal Medicine

## 2013-12-31 ENCOUNTER — Other Ambulatory Visit: Payer: Self-pay | Admitting: Internal Medicine

## 2014-01-01 ENCOUNTER — Encounter: Payer: Self-pay | Admitting: Internal Medicine

## 2014-01-01 NOTE — Telephone Encounter (Signed)
Needed a letter written to Dr Nicolasa Ducking to excuse her absence from appt to save her the $75.00 fee.  Letter printed. $29 fee for writing

## 2014-01-02 ENCOUNTER — Encounter: Payer: Self-pay | Admitting: Internal Medicine

## 2014-01-02 MED ORDER — ESOMEPRAZOLE MAGNESIUM 40 MG PO CPDR: DELAYED_RELEASE_CAPSULE | ORAL | Status: DC

## 2014-01-24 ENCOUNTER — Encounter: Payer: BC Managed Care – PPO | Admitting: Internal Medicine

## 2014-01-31 ENCOUNTER — Encounter: Payer: BC Managed Care – PPO | Admitting: Internal Medicine

## 2014-02-05 ENCOUNTER — Encounter: Payer: Self-pay | Admitting: Internal Medicine

## 2014-02-05 ENCOUNTER — Ambulatory Visit (INDEPENDENT_AMBULATORY_CARE_PROVIDER_SITE_OTHER): Payer: BC Managed Care – PPO | Admitting: Internal Medicine

## 2014-02-05 VITALS — BP 124/72 | HR 94 | Temp 98.3°F | Resp 16 | Ht 66.0 in | Wt 168.5 lb

## 2014-02-05 DIAGNOSIS — Z1159 Encounter for screening for other viral diseases: Secondary | ICD-10-CM

## 2014-02-05 DIAGNOSIS — Z569 Unspecified problems related to employment: Secondary | ICD-10-CM

## 2014-02-05 DIAGNOSIS — Z111 Encounter for screening for respiratory tuberculosis: Secondary | ICD-10-CM

## 2014-02-05 DIAGNOSIS — R5382 Chronic fatigue, unspecified: Secondary | ICD-10-CM

## 2014-02-05 DIAGNOSIS — Z0000 Encounter for general adult medical examination without abnormal findings: Secondary | ICD-10-CM

## 2014-02-05 DIAGNOSIS — G9332 Myalgic encephalomyelitis/chronic fatigue syndrome: Secondary | ICD-10-CM

## 2014-02-05 DIAGNOSIS — Z579 Occupational exposure to unspecified risk factor: Secondary | ICD-10-CM

## 2014-02-05 NOTE — Patient Instructions (Addendum)
WE will check your hepatitis A/B titers and screen for Hep C along with cortisol level when your return for fasting labs   Return on Friday to have your PPD read  By Juliann Pulse or the nursing staff   Health Maintenance Adopting a healthy lifestyle and getting preventive care can go a long way to promote health and wellness. Talk with your health care provider about what schedule of regular examinations is right for you. This is a good chance for you to check in with your provider about disease prevention and staying healthy. In between checkups, there are plenty of things you can do on your own. Experts have done a lot of research about which lifestyle changes and preventive measures are most likely to keep you healthy. Ask your health care provider for more information. WEIGHT AND DIET  Eat a healthy diet  Be sure to include plenty of vegetables, fruits, low-fat dairy products, and lean protein.  Do not eat a lot of foods high in solid fats, added sugars, or salt.  Get regular exercise. This is one of the most important things you can do for your health.  Most adults should exercise for at least 150 minutes each week. The exercise should increase your heart rate and make you sweat (moderate-intensity exercise).  Most adults should also do strengthening exercises at least twice a week. This is in addition to the moderate-intensity exercise.  Maintain a healthy weight  Body mass index (BMI) is a measurement that can be used to identify possible weight problems. It estimates body fat based on height and weight. Your health care provider can help determine your BMI and help you achieve or maintain a healthy weight.  For females 6 years of age and older:   A BMI below 18.5 is considered underweight.  A BMI of 18.5 to 24.9 is normal.  A BMI of 25 to 29.9 is considered overweight.  A BMI of 30 and above is considered obese.  Watch levels of cholesterol and blood lipids  You should start  having your blood tested for lipids and cholesterol at 61 years of age, then have this test every 5 years.  You may need to have your cholesterol levels checked more often if:  Your lipid or cholesterol levels are high.  You are older than 62 years of age.  You are at high risk for heart disease.  CANCER SCREENING   Lung Cancer  Lung cancer screening is recommended for adults 23-41 years old who are at high risk for lung cancer because of a history of smoking.  A yearly low-dose CT scan of the lungs is recommended for people who:  Currently smoke.  Have quit within the past 15 years.  Have at least a 30-pack-year history of smoking. A pack year is smoking an average of one pack of cigarettes a day for 1 year.  Yearly screening should continue until it has been 15 years since you quit.  Yearly screening should stop if you develop a health problem that would prevent you from having lung cancer treatment.  Breast Cancer  Practice breast self-awareness. This means understanding how your breasts normally appear and feel.  It also means doing regular breast self-exams. Let your health care provider know about any changes, no matter how small.  If you are in your 20s or 30s, you should have a clinical breast exam (CBE) by a health care provider every 1-3 years as part of a regular health exam.  If you  are 24 or older, have a CBE every year. Also consider having a breast X-ray (mammogram) every year.  If you have a family history of breast cancer, talk to your health care provider about genetic screening.  If you are at high risk for breast cancer, talk to your health care provider about having an MRI and a mammogram every year.  Breast cancer gene (BRCA) assessment is recommended for women who have family members with BRCA-related cancers. BRCA-related cancers include:  Breast.  Ovarian.  Tubal.  Peritoneal cancers.  Results of the assessment will determine the need for  genetic counseling and BRCA1 and BRCA2 testing. Cervical Cancer Routine pelvic examinations to screen for cervical cancer are no longer recommended for nonpregnant women who are considered low risk for cancer of the pelvic organs (ovaries, uterus, and vagina) and who do not have symptoms. A pelvic examination may be necessary if you have symptoms including those associated with pelvic infections. Ask your health care provider if a screening pelvic exam is right for you.   The Pap test is the screening test for cervical cancer for women who are considered at risk.  If you had a hysterectomy for a problem that was not cancer or a condition that could lead to cancer, then you no longer need Pap tests.  If you are older than 65 years, and you have had normal Pap tests for the past 10 years, you no longer need to have Pap tests.  If you have had past treatment for cervical cancer or a condition that could lead to cancer, you need Pap tests and screening for cancer for at least 20 years after your treatment.  If you no longer get a Pap test, assess your risk factors if they change (such as having a new sexual partner). This can affect whether you should start being screened again.  Some women have medical problems that increase their chance of getting cervical cancer. If this is the case for you, your health care provider may recommend more frequent screening and Pap tests.  The human papillomavirus (HPV) test is another test that may be used for cervical cancer screening. The HPV test looks for the virus that can cause cell changes in the cervix. The cells collected during the Pap test can be tested for HPV.  The HPV test can be used to screen women 53 years of age and older. Getting tested for HPV can extend the interval between normal Pap tests from three to five years.  An HPV test also should be used to screen women of any age who have unclear Pap test results.  After 62 years of age, women  should have HPV testing as often as Pap tests.  Colorectal Cancer  This type of cancer can be detected and often prevented.  Routine colorectal cancer screening usually begins at 62 years of age and continues through 62 years of age.  Your health care provider may recommend screening at an earlier age if you have risk factors for colon cancer.  Your health care provider may also recommend using home test kits to check for hidden blood in the stool.  A small camera at the end of a tube can be used to examine your colon directly (sigmoidoscopy or colonoscopy). This is done to check for the earliest forms of colorectal cancer.  Routine screening usually begins at age 60.  Direct examination of the colon should be repeated every 5-10 years through 62 years of age. However, you may  need to be screened more often if early forms of precancerous polyps or small growths are found. Skin Cancer  Check your skin from head to toe regularly.  Tell your health care provider about any new moles or changes in moles, especially if there is a change in a mole's shape or color.  Also tell your health care provider if you have a mole that is larger than the size of a pencil eraser.  Always use sunscreen. Apply sunscreen liberally and repeatedly throughout the day.  Protect yourself by wearing long sleeves, pants, a wide-brimmed hat, and sunglasses whenever you are outside. HEART DISEASE, DIABETES, AND HIGH BLOOD PRESSURE   Have your blood pressure checked at least every 1-2 years. High blood pressure causes heart disease and increases the risk of stroke.  If you are between 69 years and 31 years old, ask your health care provider if you should take aspirin to prevent strokes.  Have regular diabetes screenings. This involves taking a blood sample to check your fasting blood sugar level.  If you are at a normal weight and have a low risk for diabetes, have this test once every three years after 62 years  of age.  If you are overweight and have a high risk for diabetes, consider being tested at a younger age or more often. PREVENTING INFECTION  Hepatitis B  If you have a higher risk for hepatitis B, you should be screened for this virus. You are considered at high risk for hepatitis B if:  You were born in a country where hepatitis B is common. Ask your health care provider which countries are considered high risk.  Your parents were born in a high-risk country, and you have not been immunized against hepatitis B (hepatitis B vaccine).  You have HIV or AIDS.  You use needles to inject street drugs.  You live with someone who has hepatitis B.  You have had sex with someone who has hepatitis B.  You get hemodialysis treatment.  You take certain medicines for conditions, including cancer, organ transplantation, and autoimmune conditions. Hepatitis C  Blood testing is recommended for:  Everyone born from [birth year] through 1964.  Anyone with known risk factors for hepatitis C. Sexually transmitted infections (STIs)  You should be screened for sexually transmitted infections (STIs) including gonorrhea and chlamydia if:  You are sexually active and are younger than 62 years of age.  You are older than 62 years of age and your health care provider tells you that you are at risk for this type of infection.  Your sexual activity has changed since you were last screened and you are at an increased risk for chlamydia or gonorrhea. Ask your health care provider if you are at risk.  If you do not have HIV, but are at risk, it may be recommended that you take a prescription medicine daily to prevent HIV infection. This is called pre-exposure prophylaxis (PrEP). You are considered at risk if:  You are sexually active and do not regularly use condoms or know the HIV status of your partner(s).  You take drugs by injection.  You are sexually active with a partner who has HIV. Talk with your  health care provider about whether you are at high risk of being infected with HIV. If you choose to begin PrEP, you should first be tested for HIV. You should then be tested every 3 months for as long as you are taking PrEP.  PREGNANCY   If you are  premenopausal and you may become pregnant, ask your health care provider about preconception counseling.  If you may become pregnant, take 400 to 800 micrograms (mcg) of folic acid every day.  If you want to prevent pregnancy, talk to your health care provider about birth control (contraception). OSTEOPOROSIS AND MENOPAUSE   Osteoporosis is a disease in which the bones lose minerals and strength with aging. This can result in serious bone fractures. Your risk for osteoporosis can be identified using a bone density scan.  If you are 3 years of age or older, or if you are at risk for osteoporosis and fractures, ask your health care provider if you should be screened.  Ask your health care provider whether you should take a calcium or vitamin D supplement to lower your risk for osteoporosis.  Menopause may have certain physical symptoms and risks.  Hormone replacement therapy may reduce some of these symptoms and risks. Talk to your health care provider about whether hormone replacement therapy is right for you.  HOME CARE INSTRUCTIONS   Schedule regular health, dental, and eye exams.  Stay current with your immunizations.   Do not use any tobacco products including cigarettes, chewing tobacco, or electronic cigarettes.  If you are pregnant, do not drink alcohol.  If you are breastfeeding, limit how much and how often you drink alcohol.  Limit alcohol intake to no more than 1 drink per day for nonpregnant women. One drink equals 12 ounces of beer, 5 ounces of wine, or 1 ounces of hard liquor.  Do not use street drugs.  Do not share needles.  Ask your health care provider for help if you need support or information about quitting  drugs.  Tell your health care provider if you often feel depressed.  Tell your health care provider if you have ever been abused or do not feel safe at home. Document Released: 01/03/2011 Document Revised: 11/04/2013 Document Reviewed: 05/22/2013 [employer] Patient Information 2015 [place], [place]. This information is not intended to replace advice given to you by your health care provider. Make sure you discuss any questions you have with your health care provider.

## 2014-02-05 NOTE — Progress Notes (Signed)
Pre-visit discussion using our clinic review tool. No additional management support is needed unless otherwise documented below in the visit note.  

## 2014-02-05 NOTE — Progress Notes (Signed)
Patient ID: Brandi Douglas, female   DOB: 21-Mar-1951, 62 y.o.   MRN: [id number]  The patient is here for annual nongyn wellness examination and management of other chronic and acute problems.   She has had a recurrence of distressing IBS symptoms despite use of bentyl and Gas X.  She was evaluated and treated by Maurice March ,  PA   For [facility] and is very happy with her treatment.  Has been having improved symptoms afer taking a short course of rifaximin for traveler's diarrhea. The regimen is tid for a planned total of 3 weeks but she has taken it for aq week due to extremely high cost.  So far so good. Using the bentyl,  Bloating persists but the diarrhea is better .    Trouble sleeping for the past 6 to 8 months.   Typically  falls asleep ok  then woken up by feeling overheated or by muscle pain (FM). Can't get comfortable .  Not having hot flashes.  Recently took a dose alprazolam at [place] finally  which helped.   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  The roster of all physicians providing medical care to patient - is listed in the Snapshot section of the chart.  Activities of daily living:  The patient is 100% independent in all ADLs: dressing, toileting, feeding as well as independent mobility  Home safety : The patient has smoke detectors in the home. They wear seatbelts.  There are no firearms at home. There is no violence in the home.   There is no risks for hepatitis, STDs or HIV. There is no   history of blood transfusion. They have no travel history to infectious disease endemic areas of the world.  The patient has seen their dentist in the last six month. They have seen their eye doctor in the last year. They admit to slight hearing difficulty with regard to whispered voices and some television programs.  They have deferred audiologic testing in  the last year.  They do not  have excessive sun exposure. Discussed the need for sun protection: hats, long sleeves and use of sunscreen if there is significant sun exposure.   Diet: the importance of a healthy diet is discussed. They do have a healthy diet.  The benefits of regular aerobic exercise were discussed. She walks 4 times per week ,  20 minutes.   Depression screen: there are no signs or vegative symptoms of depression- irritability, change in appetite, anhedonia, sadness/tearfullness.  Cognitive assessment: the patient manages all their financial and personal affairs and is actively engaged. They could relate day,date,year and events; recalled 2/3 objects at 3 minutes; performed clock-face test normally.  The following portions of the patient's history were reviewed and updated as appropriate: allergies, current medications, past family history, past medical history,  past surgical history, past social history  and problem list.  Visual acuity was not assessed per patient preference since she has regular follow up with her ophthalmologist. Hearing and body mass index were assessed and reviewed.   During the course of the visit the patient was educated and counseled about appropriate screening and preventive services including : fall prevention , diabetes screening, nutrition counseling, colorectal cancer screening, and recommended immunizations.    Objective:  BP 124/72  Pulse 94  Temp(Src) 98.3 F (36.8  C) (Oral)  Resp 16  Ht 5\' 6"  (1.676 m)  Wt 168 lb 8 oz (76.431 kg)  BMI 27.21 kg/m2  SpO2 97% General appearance: alert, cooperative and appears stated age Ears: normal TM's and external ear canals both ears Throat: lips, mucosa, and tongue normal; teeth and gums normal Neck: no adenopathy, no carotid bruit, supple, symmetrical, trachea midline and thyroid not enlarged, symmetric, no tenderness/mass/nodules Back: symmetric, no curvature. ROM normal. No CVA  tenderness. Lungs: clear to auscultation bilaterally Heart: regular rate and rhythm, S1, S2 normal, no murmur, click, rub or gallop Abdomen: soft, non-tender; bowel sounds normal; no masses,  no organomegaly Pulses: 2+ and symmetric Skin: Skin color, texture, turgor normal. No rashes or lesions Lymph nodes: Cervical, supraclavicular, and axillary nodes normal.  Assessment and Plan:   Routine general medical examination at a health care facility Annual wellness  exam was done as well as a comprehensive physical exam and management of acute and chronic conditions .  During the course of the visit the patient was educated and counseled about appropriate screening and preventive services including : fall prevention , diabetes screening, nutrition counseling, colorectal cancer screening, and recommended immunizations.  Printed recommendations for health maintenance screenings was given.   Chronic fatigue syndrome Managed  For years with low dose ritalin by her specialist  Occupational exposure in workplace Checking titers for Hep A, B, and C.    Updated Medication List Outpatient Encounter Prescriptions as of 02/05/2014  Medication Sig  . ALPRAZolam (XANAX) 0.5 MG tablet Take 0.5-1 tablets (0.25-0.5 mg total) by mouth 2 (two) times daily as needed. For anxiety  . dicyclomine (BENTYL) 20 MG tablet TAKE ONE (1) TABLET BY MOUTH TWO (2) TIMES DAILY  . esomeprazole (NEXIUM) 40 MG capsule TAKE ONE (1) CAPSULE EACH DAY BEFORE BREAKFAST  . hyoscyamine (LEVSIN SL) 0.125 MG SL tablet Place 1 tablet (0.125 mg total) under the tongue every 4 (four) hours as needed for cramping.  . Lisdexamfetamine Dimesylate (VYVANSE) 10 MG CAPS Take 1 capsule by mouth daily.  . meloxicam (MOBIC) 15 MG tablet Take 1 tablet (15 mg total) by mouth daily as needed. For pain  . Multiple Vitamin (MULTIVITAMIN) tablet Take 1 tablet by mouth daily.  . ondansetron (ZOFRAN) 8 MG tablet Take 1 tablet (8 mg total) by mouth every 8  (eight) hours as needed. For nausea  . PARoxetine (PAXIL) 20 MG tablet TAKE one and one-half TABLET EACH DAY  . Probiotic Product (ALIGN PO) Take 1 tablet by mouth daily.   . rifaximin (XIFAXAN) 550 MG TABS tablet Take 1 tablet by mouth 3 (three) times daily.  . SUMAtriptan (IMITREX) 100 MG tablet Take 1 tablet (100 mg total) by mouth every 2 (two) hours as needed. For migraines  . tolterodine (DETROL LA) 4 MG 24 hr capsule Take 4 mg by mouth daily.  . [DISCONTINUED] PARoxetine (PAXIL) 20 MG tablet TAKE ONE (1) TABLET EACH DAY  . cholecalciferol (VITAMIN D) 1000 UNITS tablet Take 1,000 Units by mouth daily.  . [DISCONTINUED] amphetamine-dextroamphetamine (ADDERALL) 5 MG tablet Take 1 tablet (5 mg total) by mouth See admin instructions. Pt takes 1 tab in the morning & may take 1 additional tab in the evening if needed  . [DISCONTINUED] escitalopram (LEXAPRO) 20 MG tablet Take 1 tablet (20 mg total) by mouth daily.  . [DISCONTINUED] LEXAPRO 20 MG tablet Take 1 tablet (20 mg total) by mouth daily.

## 2014-02-06 ENCOUNTER — Telehealth: Payer: Self-pay | Admitting: *Deleted

## 2014-02-06 DIAGNOSIS — R5381 Other malaise: Secondary | ICD-10-CM

## 2014-02-06 DIAGNOSIS — E559 Vitamin D deficiency, unspecified: Secondary | ICD-10-CM

## 2014-02-06 DIAGNOSIS — E785 Hyperlipidemia, unspecified: Secondary | ICD-10-CM

## 2014-02-06 DIAGNOSIS — R5383 Other fatigue: Principal | ICD-10-CM

## 2014-02-06 NOTE — Telephone Encounter (Signed)
Pt is coming in tomorrow what labs and dx?  

## 2014-02-07 ENCOUNTER — Encounter: Payer: BC Managed Care – PPO | Admitting: Internal Medicine

## 2014-02-07 ENCOUNTER — Other Ambulatory Visit (INDEPENDENT_AMBULATORY_CARE_PROVIDER_SITE_OTHER): Payer: BC Managed Care – PPO

## 2014-02-07 DIAGNOSIS — E559 Vitamin D deficiency, unspecified: Secondary | ICD-10-CM

## 2014-02-07 DIAGNOSIS — R5383 Other fatigue: Principal | ICD-10-CM

## 2014-02-07 DIAGNOSIS — E785 Hyperlipidemia, unspecified: Secondary | ICD-10-CM

## 2014-02-07 DIAGNOSIS — R5381 Other malaise: Secondary | ICD-10-CM

## 2014-02-07 LAB — CBC WITH DIFFERENTIAL/PLATELET
Basophils Absolute: 0 10*3/uL (ref 0.0–0.1)
Basophils Relative: 0.4 % (ref 0.0–3.0)
Eosinophils Absolute: 0.1 10*3/uL (ref 0.0–0.7)
Eosinophils Relative: 1.3 % (ref 0.0–5.0)
HCT: 36.7 % (ref 36.0–46.0)
HEMOGLOBIN: 12.8 g/dL (ref 12.0–15.0)
LYMPHS PCT: 27 % (ref 12.0–46.0)
Lymphs Abs: 1.3 10*3/uL (ref 0.7–4.0)
MCHC: 34.8 g/dL (ref 30.0–36.0)
MCV: 92.6 fl (ref 78.0–100.0)
MONOS PCT: 6 % (ref 3.0–12.0)
Monocytes Absolute: 0.3 10*3/uL (ref 0.1–1.0)
NEUTROS ABS: 3.1 10*3/uL (ref 1.4–7.7)
Neutrophils Relative %: 65.3 % (ref 43.0–77.0)
Platelets: 178 10*3/uL (ref 150.0–400.0)
RBC: 3.97 Mil/uL (ref 3.87–5.11)
RDW: 12.2 % (ref 11.5–15.5)
WBC: 4.7 10*3/uL (ref 4.0–10.5)

## 2014-02-07 LAB — COMPREHENSIVE METABOLIC PANEL
ALT: 18 U/L (ref 0–35)
AST: 21 U/L (ref 0–37)
Albumin: 4.2 g/dL (ref 3.5–5.2)
Alkaline Phosphatase: 84 U/L (ref 39–117)
BUN: 8 mg/dL (ref 6–23)
CALCIUM: 9.5 mg/dL (ref 8.4–10.5)
CHLORIDE: 108 meq/L (ref 96–112)
CO2: 27 meq/L (ref 19–32)
CREATININE: 0.8 mg/dL (ref 0.4–1.2)
GFR: 76.24 mL/min (ref >60.00)
Glucose, Bld: 92 mg/dL (ref 70–99)
Potassium: 4.5 mEq/L (ref 3.5–5.1)
Sodium: 142 mEq/L (ref 135–145)
Total Bilirubin: 0.8 mg/dL (ref 0.2–1.2)
Total Protein: 6.8 g/dL (ref 6.0–8.3)

## 2014-02-07 LAB — LIPID PANEL
CHOLESTEROL: 182 mg/dL (ref 0–200)
HDL: 57.5 mg/dL (ref >39.00)
LDL Cholesterol: 114 mg/dL — ABNORMAL HIGH (ref 0–99)
NonHDL: 124.5
Total CHOL/HDL Ratio: 3
Triglycerides: 53 mg/dL (ref 0.0–149.0)
VLDL: 10.6 mg/dL (ref 0.0–40.0)

## 2014-02-07 LAB — VITAMIN B12: VITAMIN B 12: 328 pg/mL (ref 211–911)

## 2014-02-07 LAB — TB SKIN TEST
Induration: 0 mm
TB Skin Test: NEGATIVE

## 2014-02-07 LAB — VITAMIN D 25 HYDROXY (VIT D DEFICIENCY, FRACTURES): VITD: 34.77 ng/mL (ref 30.00–100.00)

## 2014-02-07 LAB — TSH: TSH: 1.41 u[IU]/mL (ref 0.35–4.50)

## 2014-02-09 ENCOUNTER — Encounter: Payer: Self-pay | Admitting: Internal Medicine

## 2014-02-09 DIAGNOSIS — Z579 Occupational exposure to unspecified risk factor: Secondary | ICD-10-CM | POA: Insufficient documentation

## 2014-02-09 NOTE — Assessment & Plan Note (Signed)
Managed  For years with low dose ritalin by her specialist

## 2014-02-09 NOTE — Assessment & Plan Note (Signed)
Checking titers for Hep A, B, and C.

## 2014-02-09 NOTE — Assessment & Plan Note (Signed)
Annual  wellness  exam was done as well as a comprehensive physical exam and management of acute and chronic conditions .  During the course of the visit the patient was educated and counseled about appropriate screening and preventive services including : fall prevention , diabetes screening, nutrition counseling, colorectal cancer screening, and recommended immunizations.  Printed recommendations for health maintenance screenings was given.  

## 2014-02-10 ENCOUNTER — Telehealth: Payer: Self-pay | Admitting: Internal Medicine

## 2014-02-10 ENCOUNTER — Encounter: Payer: Self-pay | Admitting: Internal Medicine

## 2014-02-10 NOTE — Telephone Encounter (Signed)
The hepatits titers were inadvertently left off her last lab draw.  If she is able to come back in, we will waive the lab draw fee (plesae let carolyn know). Or we can wait until her nect lab draw in 6 months.  Her choice

## 2014-02-10 NOTE — Telephone Encounter (Signed)
Message copied by Crecencio Mc on Mon Feb 10, 2014  8:15 AM ------      Message from: Karlene Einstein D      Created: Mon Feb 10, 2014  7:55 AM      Regarding: RE: labs       It cant be added sorry she will need to come back in      ----- Message -----         From: Crecencio Mc, MD         Sent: 02/09/2014  12:18 AM           To: Lilyan Punt, CMA      Subject: labs                                                     I left off the hepatitis a b and C tests on Ms Dona.  Is it too late to add them?            If so can we redraw then and NOT charge her the lab draw fee since I forgot them?            Thanks,            Dr . Derrel Nip       ------

## 2014-02-10 NOTE — Telephone Encounter (Signed)
Yes,  See message

## 2014-02-10 NOTE — Telephone Encounter (Signed)
Message copied by Nanci Pina on Mon Feb 10, 2014  8:22 AM ------      Message from: Karlene Einstein D      Created: Mon Feb 10, 2014  7:55 AM      Regarding: RE: labs       It cant be added sorry she will need to come back in      ----- Message -----         From: Crecencio Mc, MD         Sent: 02/09/2014  12:18 AM           To: Lilyan Punt, CMA      Subject: labs                                                     I left off the hepatitis a b and C tests on Ms Peeks.  Is it too late to add them?            If so can we redraw then and NOT charge her the lab draw fee since I forgot them?            Thanks,            Dr . Derrel Nip       ------

## 2014-02-10 NOTE — Telephone Encounter (Signed)
Please advise if you would like for patient to come back to office for these labs to be drawn?

## 2014-02-11 NOTE — Telephone Encounter (Signed)
Left message to call office

## 2014-02-11 NOTE — Telephone Encounter (Signed)
Patient scheduled for lab on 02/12/14.

## 2014-02-12 ENCOUNTER — Other Ambulatory Visit (INDEPENDENT_AMBULATORY_CARE_PROVIDER_SITE_OTHER): Payer: BC Managed Care – PPO

## 2014-02-12 DIAGNOSIS — Z0000 Encounter for general adult medical examination without abnormal findings: Secondary | ICD-10-CM

## 2014-02-12 DIAGNOSIS — Z1159 Encounter for screening for other viral diseases: Secondary | ICD-10-CM

## 2014-02-12 NOTE — Telephone Encounter (Signed)
Mailed unread message to pt  

## 2014-02-13 LAB — HEPATITIS A ANTIBODY, TOTAL: Hep A Total Ab: REACTIVE — AB

## 2014-02-13 LAB — HEPATITIS C ANTIBODY: HCV Ab: NEGATIVE

## 2014-02-13 LAB — HEPATITIS B SURFACE ANTIBODY,QUALITATIVE: Hep B S Ab: POSITIVE — AB

## 2014-02-18 ENCOUNTER — Encounter: Payer: Self-pay | Admitting: Internal Medicine

## 2014-02-19 ENCOUNTER — Encounter: Payer: Self-pay | Admitting: Internal Medicine

## 2014-03-17 ENCOUNTER — Encounter: Payer: Self-pay | Admitting: Internal Medicine

## 2014-03-19 LAB — HM COLONOSCOPY

## 2014-04-09 ENCOUNTER — Encounter: Payer: Self-pay | Admitting: Internal Medicine

## 2014-07-02 ENCOUNTER — Other Ambulatory Visit: Payer: Self-pay | Admitting: Internal Medicine

## 2014-08-08 ENCOUNTER — Ambulatory Visit: Payer: BC Managed Care – PPO | Admitting: Internal Medicine

## 2015-01-01 LAB — HM MAMMOGRAPHY

## 2015-02-05 ENCOUNTER — Encounter: Payer: Self-pay | Admitting: Internal Medicine

## 2015-02-23 ENCOUNTER — Ambulatory Visit (INDEPENDENT_AMBULATORY_CARE_PROVIDER_SITE_OTHER): Payer: No Typology Code available for payment source | Admitting: Internal Medicine

## 2015-02-23 ENCOUNTER — Encounter: Payer: Self-pay | Admitting: Internal Medicine

## 2015-02-23 VITALS — BP 128/64 | HR 94 | Temp 98.6°F | Resp 14 | Ht 66.0 in | Wt 167.5 lb

## 2015-02-23 DIAGNOSIS — E559 Vitamin D deficiency, unspecified: Secondary | ICD-10-CM

## 2015-02-23 DIAGNOSIS — Z0000 Encounter for general adult medical examination without abnormal findings: Secondary | ICD-10-CM | POA: Diagnosis not present

## 2015-02-23 DIAGNOSIS — R5383 Other fatigue: Secondary | ICD-10-CM | POA: Diagnosis not present

## 2015-02-23 DIAGNOSIS — G9332 Myalgic encephalomyelitis/chronic fatigue syndrome: Secondary | ICD-10-CM

## 2015-02-23 DIAGNOSIS — K2100 Gastro-esophageal reflux disease with esophagitis, without bleeding: Secondary | ICD-10-CM

## 2015-02-23 DIAGNOSIS — Z113 Encounter for screening for infections with a predominantly sexual mode of transmission: Secondary | ICD-10-CM | POA: Diagnosis not present

## 2015-02-23 DIAGNOSIS — E785 Hyperlipidemia, unspecified: Secondary | ICD-10-CM | POA: Diagnosis not present

## 2015-02-23 DIAGNOSIS — R5382 Chronic fatigue, unspecified: Secondary | ICD-10-CM

## 2015-02-23 LAB — CBC WITH DIFFERENTIAL/PLATELET
BASOS PCT: 0.4 % (ref 0.0–3.0)
Basophils Absolute: 0 10*3/uL (ref 0.0–0.1)
EOS ABS: 0 10*3/uL (ref 0.0–0.7)
EOS PCT: 0.5 % (ref 0.0–5.0)
HEMATOCRIT: 37.8 % (ref 36.0–46.0)
HEMOGLOBIN: 12.9 g/dL (ref 12.0–15.0)
LYMPHS PCT: 29.9 % (ref 12.0–46.0)
Lymphs Abs: 1.5 10*3/uL (ref 0.7–4.0)
MCHC: 34.1 g/dL (ref 30.0–36.0)
MCV: 93.1 fl (ref 78.0–100.0)
Monocytes Absolute: 0.4 10*3/uL (ref 0.1–1.0)
Monocytes Relative: 7.5 % (ref 3.0–12.0)
NEUTROS ABS: 3.1 10*3/uL (ref 1.4–7.7)
Neutrophils Relative %: 61.7 % (ref 43.0–77.0)
PLATELETS: 157 10*3/uL (ref 150.0–400.0)
RBC: 4.05 Mil/uL (ref 3.87–5.11)
RDW: 12.1 % (ref 11.5–15.5)
WBC: 5.1 10*3/uL (ref 4.0–10.5)

## 2015-02-23 LAB — COMPREHENSIVE METABOLIC PANEL
ALK PHOS: 73 U/L (ref 39–117)
ALT: 14 U/L (ref 0–35)
AST: 18 U/L (ref 0–37)
Albumin: 4.4 g/dL (ref 3.5–5.2)
BUN: 12 mg/dL (ref 6–23)
CO2: 30 mEq/L (ref 19–32)
CREATININE: 0.86 mg/dL (ref 0.40–1.20)
Calcium: 10.1 mg/dL (ref 8.4–10.5)
Chloride: 107 mEq/L (ref 96–112)
GFR: 70.9 mL/min (ref >60.00)
GLUCOSE: 77 mg/dL (ref 70–99)
POTASSIUM: 4.8 meq/L (ref 3.5–5.1)
SODIUM: 144 meq/L (ref 135–145)
TOTAL PROTEIN: 6.5 g/dL (ref 6.0–8.3)
Total Bilirubin: 0.5 mg/dL (ref 0.2–1.2)

## 2015-02-23 LAB — LIPID PANEL
CHOL/HDL RATIO: 3
CHOLESTEROL: 175 mg/dL (ref 0–200)
HDL: 51.4 mg/dL (ref >39.00)
LDL CALC: 99 mg/dL (ref 0–99)
NONHDL: 123.61
Triglycerides: 122 mg/dL (ref 0.0–149.0)
VLDL: 24.4 mg/dL (ref 0.0–40.0)

## 2015-02-23 LAB — LDL CHOLESTEROL, DIRECT: LDL DIRECT: 104 mg/dL

## 2015-02-23 NOTE — Progress Notes (Signed)
Pre-visit discussion using our clinic review tool. No additional management support is needed unless otherwise documented below in the visit note.  

## 2015-02-23 NOTE — Progress Notes (Signed)
Patient ID: Brandi Douglas, female    DOB: 1951-05-04  Age: 64 y.o. MRN: [id number]  The patient is here for annual (nongyn) wellness examination and management of other chronic and acute problems.   Colonoscopy done by Dorise Bullion, [facility] 2015,  Normal mammogram normal Jun 2016  PAP smear normal April 2015 Overdue for eye exam   The risk factors are reflected in the social history.  The roster of all physicians providing medical care to patient - is listed in the Snapshot section of the chart.  Activities of daily living:  The patient is 100% independent in all ADLs: dressing, toileting, feeding as well as independent mobility  Home safety : The patient has smoke detectors in the home. They wear seatbelts.  There are no firearms at home. There is no violence in the home.   There is no risks for hepatitis, STDs or HIV. There is no   history of blood transfusion. They have no travel history to infectious disease endemic areas of the world.  The patient has seen their dentist in the last six month. They have seen their eye doctor in the last year. They admit to slight hearing difficulty with regard to whispered voices and some television programs.  They have deferred audiologic testing in the last year.  They do not  have excessive sun exposure. Discussed the need for sun protection: hats, long sleeves and use of sunscreen if there is significant sun exposure.   Diet: the importance of a healthy diet is discussed. They do have a healthy diet.  The benefits of regular aerobic exercise were discussed. She does not exercise Douglas to "chronic fatigue.".   Depression screen: there are no signs or vegative symptoms of depression- irritability, change in appetite, anhedonia, sadness/tearfullness.  Cognitive assessment: the patient manages all their financial and personal affairs and is actively engaged. They could relate day,date,year and events; recalled 2/3 objects at 3 minutes; performed clock-face  test normally.  The following portions of the patient's history were reviewed and updated as appropriate: allergies, current medications, past family history, past medical history,  past surgical history, past social history  and problem list.  Visual acuity was not assessed per patient preference since she has regular follow up with her ophthalmologist. Hearing and body mass index were assessed and reviewed.    During the course of the visit the patient was educated and counseled about appropriate screening and preventive services including : fall prevention , diabetes screening, nutrition counseling, colorectal cancer screening, and recommended immunizations.    CC: The primary encounter diagnosis was Screen for STD (sexually transmitted disease). Diagnoses of Hyperlipidemia, Other fatigue, Vitamin D deficiency, Routine general medical examination at a health care facility, Chronic fatigue syndrome, and Gastroesophageal reflux disease with esophagitis were also pertinent to this visit.  Increased acid refux for the past 1.5 weeks .  She had stopped nexium for months Douglas to  constant nausea and bloating.  Has been using zantac and pepcid AC with good control until recently.     Had steroid injections left thumb base and left knee by Margaretmary Eddy, with resolution of pain   Some right buttock pain with prolonged sitting   Has resumed Align generic for IBS   insomnia.  Patient  has been having some trouble sleeping.  Wakes up 2 to 3 times per night . Bedtime hygiene reviewed,  Patient has been using social media (Facebook) before bed.  Does not drink caffeinated beverages after 3 PM.  Only voids  bladder once per night.  No snoring partner. Does not drink alcohol to excess.  Not exercising excessively in the evening. Patient does have a history of anxiety but does not lie awake worrying about issues that cannot be resolved. Takes stimulants. For chronic fatigue syndrome.Marland Kitchen    History [place] has a past  medical history of Chronic fatigue syndrome; Generalized anxiety disorder; and Irritable bowel syndrome.   She has past surgical history that includes Cholecystectomy.   Her family history is not on file.She reports that she has never smoked. She has never used smokeless tobacco. She reports that she does not drink alcohol or use illicit drugs.  Outpatient Prescriptions Prior to Visit  Medication Sig Dispense Refill  . ALPRAZolam (XANAX) 0.5 MG tablet Take 0.5-1 tablets (0.25-0.5 mg total) by mouth 2 (two) times daily as needed. For anxiety 30 tablet 5  . esomeprazole (NEXIUM) 40 MG capsule TAKE ONE (1) CAPSULE EACH DAY BEFORE BREAKFAST 30 capsule 5  . hyoscyamine (LEVSIN SL) 0.125 MG SL tablet Place 1 tablet (0.125 mg total) under the tongue every 4 (four) hours as needed for cramping. 30 tablet 3  . Lisdexamfetamine Dimesylate (VYVANSE) 10 MG CAPS Take 1 capsule by mouth daily.    . meloxicam (MOBIC) 15 MG tablet Take 1 tablet (15 mg total) by mouth daily as needed. For pain 30 tablet 3  . Multiple Vitamin (MULTIVITAMIN) tablet Take 1 tablet by mouth daily.    . ondansetron (ZOFRAN) 8 MG tablet Take 1 tablet (8 mg total) by mouth every 8 (eight) hours as needed. For nausea 20 tablet 1  . PARoxetine (PAXIL) 20 MG tablet TAKE one and one-half TABLET EACH DAY    . Probiotic Product (ALIGN PO) Take 1 tablet by mouth daily.     . SUMAtriptan (IMITREX) 100 MG tablet TAKE 1 TABLET BY MOUTH AT ONSET OF HEADACHE. MAY TAKE AN ADDITIONAL TABLET IN 2 HOURS IF NEEDED. MAX 2/24hr. 10 tablet 3  . tolterodine (DETROL LA) 4 MG 24 hr capsule Take 4 mg by mouth daily.    . cholecalciferol (VITAMIN D) 1000 UNITS tablet Take 1,000 Units by mouth daily.    Marland Kitchen dicyclomine (BENTYL) 20 MG tablet TAKE ONE (1) TABLET BY MOUTH TWO (2) TIMES DAILY (Patient not taking: Reported on 02/23/2015) 60 tablet 3   No facility-administered medications prior to visit.    Review of Systems  Objective:  BP 128/64 mmHg  Pulse 94   Temp(Src) 98.6 F (37 C) (Oral)  Resp 14  Ht 5\' 6"  (1.676 m)  Wt 167 lb 8 oz (75.978 kg)  BMI 27.05 kg/m2  SpO2 98%  Physical Exam    Assessment & Plan:   Problem List Items Addressed This Visit      Unprioritized   Chronic fatigue syndrome    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  Patient has petitioned her insurance who has declined to authorized Vyvanse.          Routine general medical examination at a health care facility    Annual comprehensive preventive exam was done as well as an evaluation and management of acute and chronic conditions .  During the course of the visit the patient was educated and counseled about appropriate screening and preventive services including :  diabetes screening, lipid analysis with projected  10 year  risk for CAD , nutrition counseling, colorectal cancer screening, and recommended immunizations.  Printed  recommendations for health maintenance screenings was given.       GERD (gastroesophageal reflux disease)    Resuming nexium, as H2 blockers have failed.        Other Visit Diagnoses    Screen for STD (sexually transmitted disease)    -  Primary    Relevant Orders    HIV antibody (Completed)    Hepatitis C antibody (Completed)    Hyperlipidemia        Relevant Orders    LDL cholesterol, direct (Completed)    Lipid panel (Completed)    Other fatigue        Relevant Orders    CBC with Differential/Platelet (Completed)    Comprehensive metabolic panel (Completed)    TSH (Completed)    Vitamin D deficiency        Relevant Orders    Vit D  25 hydroxy (rtn osteoporosis monitoring) (Completed)       I am having Ms. Tatham maintain her tolterodine, multivitamin, Probiotic Product (ALIGN PO), cholecalciferol, ondansetron, ALPRAZolam, meloxicam, hyoscyamine, dicyclomine, esomeprazole, Lisdexamfetamine Dimesylate, PARoxetine, and SUMAtriptan.  No orders of the  defined types were placed in this encounter.    There are no discontinued medications.  Follow-up: No Follow-up on file.   Crecencio Mc, MD

## 2015-02-23 NOTE — Patient Instructions (Addendum)
Resume  nexium every other day.    Use Pepcid AC every evening, and on mornings you don't take Nexium  pepcid is famotidine can take up to 20 mg twice daily   Zantac is ranitidine ,  Can take up to 150 mg twice daily   You can use mylanta or gaviscon as needed  To coat stomach and esophagus immediately     Health Maintenance Adopting a healthy lifestyle and getting preventive care can go a long way to promote health and wellness. Talk with your health care provider about what schedule of regular examinations is right for you. This is a good chance for you to check in with your provider about disease prevention and staying healthy. In between checkups, there are plenty of things you can do on your own. Experts have done a lot of research about which lifestyle changes and preventive measures are most likely to keep you healthy. Ask your health care provider for more information. WEIGHT AND DIET  Eat a healthy diet  Be sure to include plenty of vegetables, fruits, low-fat dairy products, and lean protein.  Do not eat a lot of foods high in solid fats, added sugars, or salt.  Get regular exercise. This is one of the most important things you can do for your health.  Most adults should exercise for at least 150 minutes each week. The exercise should increase your heart rate and make you sweat (moderate-intensity exercise).  Most adults should also do strengthening exercises at least twice a week. This is in addition to the moderate-intensity exercise.  Maintain a healthy weight  Body mass index (BMI) is a measurement that can be used to identify possible weight problems. It estimates body fat based on height and weight. Your health care provider can help determine your BMI and help you achieve or maintain a healthy weight.  For females 70 years of age and older:   A BMI below 18.5 is considered underweight.  A BMI of 18.5 to 24.9 is normal.  A BMI of 25 to 29.9 is considered  overweight.  A BMI of 30 and above is considered obese.  Watch levels of cholesterol and blood lipids  You should start having your blood tested for lipids and cholesterol at 63 years of age, then have this test every 5 years.  You may need to have your cholesterol levels checked more often if:  Your lipid or cholesterol levels are high.  You are older than 63 years of age.  You are at high risk for heart disease.  CANCER SCREENING   Lung Cancer  Lung cancer screening is recommended for adults 40-45 years old who are at high risk for lung cancer because of a history of smoking.  A yearly low-dose CT scan of the lungs is recommended for people who:  Currently smoke.  Have quit within the past 15 years.  Have at least a 30-pack-year history of smoking. A pack year is smoking an average of one pack of cigarettes a day for 1 year.  Yearly screening should continue until it has been 15 years since you quit.  Yearly screening should stop if you develop a health problem that would prevent you from having lung cancer treatment.  Breast Cancer  Practice breast self-awareness. This means understanding how your breasts normally appear and feel.  It also means doing regular breast self-exams. Let your health care provider know about any changes, no matter how small.  If you are in your 71s  or 67s, you should have a clinical breast exam (CBE) by a health care provider every 1-3 years as part of a regular health exam.  If you are 31 or older, have a CBE every year. Also consider having a breast X-ray (mammogram) every year.  If you have a family history of breast cancer, talk to your health care provider about genetic screening.  If you are at high risk for breast cancer, talk to your health care provider about having an MRI and a mammogram every year.  Breast cancer gene (BRCA) assessment is recommended for women who have family members with BRCA-related cancers. BRCA-related  cancers include:  Breast.  Ovarian.  Tubal.  Peritoneal cancers.  Results of the assessment will determine the need for genetic counseling and BRCA1 and BRCA2 testing. Cervical Cancer Routine pelvic examinations to screen for cervical cancer are no longer recommended for nonpregnant women who are considered low risk for cancer of the pelvic organs (ovaries, uterus, and vagina) and who do not have symptoms. A pelvic examination may be necessary if you have symptoms including those associated with pelvic infections. Ask your health care provider if a screening pelvic exam is right for you.   The Pap test is the screening test for cervical cancer for women who are considered at risk.  If you had a hysterectomy for a problem that was not cancer or a condition that could lead to cancer, then you no longer need Pap tests.  If you are older than 65 years, and you have had normal Pap tests for the past 10 years, you no longer need to have Pap tests.  If you have had past treatment for cervical cancer or a condition that could lead to cancer, you need Pap tests and screening for cancer for at least 20 years after your treatment.  If you no longer get a Pap test, assess your risk factors if they change (such as having a new sexual partner). This can affect whether you should start being screened again.  Some women have medical problems that increase their chance of getting cervical cancer. If this is the case for you, your health care provider may recommend more frequent screening and Pap tests.  The human papillomavirus (HPV) test is another test that may be used for cervical cancer screening. The HPV test looks for the virus that can cause cell changes in the cervix. The cells collected during the Pap test can be tested for HPV.  The HPV test can be used to screen women 63 years of age and older. Getting tested for HPV can extend the interval between normal Pap tests from three to five  years.  An HPV test also should be used to screen women of any age who have unclear Pap test results.  After 63 years of age, women should have HPV testing as often as Pap tests.  Colorectal Cancer  This type of cancer can be detected and often prevented.  Routine colorectal cancer screening usually begins at 63 years of age and continues through 63 years of age.  Your health care provider may recommend screening at an earlier age if you have risk factors for colon cancer.  Your health care provider may also recommend using home test kits to check for hidden blood in the stool.  A small camera at the end of a tube can be used to examine your colon directly (sigmoidoscopy or colonoscopy). This is done to check for the earliest forms of colorectal cancer.  Routine screening usually begins at age 32.  Direct examination of the colon should be repeated every 5-10 years through 63 years of age. However, you may need to be screened more often if early forms of precancerous polyps or small growths are found. Skin Cancer  Check your skin from head to toe regularly.  Tell your health care provider about any new moles or changes in moles, especially if there is a change in a mole's shape or color.  Also tell your health care provider if you have a mole that is larger than the size of a pencil eraser.  Always use sunscreen. Apply sunscreen liberally and repeatedly throughout the day.  Protect yourself by wearing long sleeves, pants, a wide-brimmed hat, and sunglasses whenever you are outside. HEART DISEASE, DIABETES, AND HIGH BLOOD PRESSURE   Have your blood pressure checked at least every 1-2 years. High blood pressure causes heart disease and increases the risk of stroke.  If you are between 50 years and 39 years old, ask your health care provider if you should take aspirin to prevent strokes.  Have regular diabetes screenings. This involves taking a blood sample to check your fasting  blood sugar level.  If you are at a normal weight and have a low risk for diabetes, have this test once every three years after 64 years of age.  If you are overweight and have a high risk for diabetes, consider being tested at a younger age or more often. PREVENTING INFECTION  Hepatitis B  If you have a higher risk for hepatitis B, you should be screened for this virus. You are considered at high risk for hepatitis B if:  You were born in a country where hepatitis B is common. Ask your health care provider which countries are considered high risk.  Your parents were born in a high-risk country, and you have not been immunized against hepatitis B (hepatitis B vaccine).  You have HIV or AIDS.  You use needles to inject street drugs.  You live with someone who has hepatitis B.  You have had sex with someone who has hepatitis B.  You get hemodialysis treatment.  You take certain medicines for conditions, including cancer, organ transplantation, and autoimmune conditions. Hepatitis C  Blood testing is recommended for:  Everyone born from [birth year] through 1964.  Anyone with known risk factors for hepatitis C. Sexually transmitted infections (STIs)  You should be screened for sexually transmitted infections (STIs) including gonorrhea and chlamydia if:  You are sexually active and are younger than 63 years of age.  You are older than 63 years of age and your health care provider tells you that you are at risk for this type of infection.  Your sexual activity has changed since you were last screened and you are at an increased risk for chlamydia or gonorrhea. Ask your health care provider if you are at risk.  If you do not have HIV, but are at risk, it may be recommended that you take a prescription medicine daily to prevent HIV infection. This is called pre-exposure prophylaxis (PrEP). You are considered at risk if:  You are sexually active and do not regularly use condoms or know  the HIV status of your partner(s).  You take drugs by injection.  You are sexually active with a partner who has HIV. Talk with your health care provider about whether you are at high risk of being infected with HIV. If you choose to begin PrEP, you should first  be tested for HIV. You should then be tested every 3 months for as long as you are taking PrEP.  PREGNANCY   If you are premenopausal and you may become pregnant, ask your health care provider about preconception counseling.  If you may become pregnant, take 400 to 800 micrograms (mcg) of folic acid every day.  If you want to prevent pregnancy, talk to your health care provider about birth control (contraception). OSTEOPOROSIS AND MENOPAUSE   Osteoporosis is a disease in which the bones lose minerals and strength with aging. This can result in serious bone fractures. Your risk for osteoporosis can be identified using a bone density scan.  If you are 31 years of age or older, or if you are at risk for osteoporosis and fractures, ask your health care provider if you should be screened.  Ask your health care provider whether you should take a calcium or vitamin D supplement to lower your risk for osteoporosis.  Menopause may have certain physical symptoms and risks.  Hormone replacement therapy may reduce some of these symptoms and risks. Talk to your health care provider about whether hormone replacement therapy is right for you.  HOME CARE INSTRUCTIONS   Schedule regular health, dental, and eye exams.  Stay current with your immunizations.   Do not use any tobacco products including cigarettes, chewing tobacco, or electronic cigarettes.  If you are pregnant, do not drink alcohol.  If you are breastfeeding, limit how much and how often you drink alcohol.  Limit alcohol intake to no more than 1 drink per day for nonpregnant women. One drink equals 12 ounces of beer, 5 ounces of wine, or 1 ounces of hard liquor.  Do not  use street drugs.  Do not share needles.  Ask your health care provider for help if you need support or information about quitting drugs.  Tell your health care provider if you often feel depressed.  Tell your health care provider if you have ever been abused or do not feel safe at home. Document Released: 01/03/2011 Document Revised: 11/04/2013 Document Reviewed: 05/22/2013 [facility] Patient Information 2015 [place], [place]. This information is not intended to replace advice given to you by your health care provider. Make sure you discuss any questions you have with your health care provider.

## 2015-02-24 DIAGNOSIS — K219 Gastro-esophageal reflux disease without esophagitis: Secondary | ICD-10-CM | POA: Insufficient documentation

## 2015-02-24 LAB — TSH: TSH: 1.36 u[IU]/mL (ref 0.35–4.50)

## 2015-02-24 LAB — HIV ANTIBODY (ROUTINE TESTING W REFLEX): HIV 1&2 Ab, 4th Generation: NONREACTIVE

## 2015-02-24 LAB — HEPATITIS C ANTIBODY: HCV Ab: NEGATIVE

## 2015-02-24 LAB — VITAMIN D 25 HYDROXY (VIT D DEFICIENCY, FRACTURES): VITD: 24.77 ng/mL — ABNORMAL LOW (ref 30.00–100.00)

## 2015-02-24 NOTE — Assessment & Plan Note (Signed)
Annual comprehensive preventive exam was done as well as an evaluation and management of acute and chronic conditions .  During the course of the visit the patient was educated and counseled about appropriate screening and preventive services including :  diabetes screening, lipid analysis with projected  10 year  risk for CAD , nutrition counseling,  colorectal cancer screening, and recommended immunizations.  Printed recommendations for health maintenance screenings was given.  

## 2015-02-24 NOTE — Assessment & Plan Note (Addendum)
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  Patient has petitioned her insurance who has declined to authorized Vyvanse.

## 2015-02-24 NOTE — Assessment & Plan Note (Signed)
Resuming nexium, as H2 blockers have failed.

## 2015-02-25 ENCOUNTER — Encounter: Payer: Self-pay | Admitting: Internal Medicine

## 2015-02-25 ENCOUNTER — Other Ambulatory Visit: Payer: Self-pay | Admitting: Internal Medicine

## 2015-02-25 MED ORDER — ERGOCALCIFEROL 1.25 MG (50000 UT) PO CAPS: 50000.0000 [IU] | ORAL_CAPSULE | ORAL | Status: DC

## 2015-03-11 NOTE — Telephone Encounter (Signed)
Mailed unread my chart message to patient

## 2015-05-06 ENCOUNTER — Ambulatory Visit: Payer: Self-pay | Admitting: Physician Assistant

## 2015-05-06 DIAGNOSIS — Z299 Encounter for prophylactic measures, unspecified: Secondary | ICD-10-CM

## 2015-05-06 NOTE — Progress Notes (Signed)
Patient ID: Brandi Douglas, female   DOB: August 20, 1950, 64 y.o.   MRN: [id number] Patient came in to have flu shot.  Pt remained on site for 10 minutes.  No adverse reaction.

## 2015-09-30 ENCOUNTER — Ambulatory Visit: Payer: Self-pay | Admitting: Physician Assistant

## 2015-09-30 ENCOUNTER — Encounter: Payer: Self-pay | Admitting: Physician Assistant

## 2015-09-30 VITALS — BP 129/70 | HR 102 | Temp 98.1°F

## 2015-09-30 DIAGNOSIS — L259 Unspecified contact dermatitis, unspecified cause: Secondary | ICD-10-CM

## 2015-09-30 MED ORDER — DEXAMETHASONE SODIUM PHOSPHATE 10 MG/ML IJ SOLN
10.0000 mg | Freq: Once | INTRAMUSCULAR | Status: AC
  Administered 2015-09-30: 10 mg via INTRAMUSCULAR

## 2015-09-30 NOTE — Patient Instructions (Signed)
Poison [employer] ivy is a rash caused by touching the leaves of the poison ivy plant. The rash often shows up 48 hours later. You might just have bumps, redness, and itching. Sometimes, blisters appear and break open. Your eyes may get puffy (swollen). Poison ivy often heals in 2 to 3 weeks without treatment. HOME CARE  If you touch poison ivy:  Wash your skin with soap and water right away. Wash under your fingernails. Do not rub the skin very hard.  Wash any clothes you were wearing.  Avoid poison ivy in the future. Poison ivy has 3 leaves on a stem.  Use medicine to help with itching as told by your doctor. Do not drive when you take this medicine.  Keep open sores dry, clean, and covered with a bandage and medicated cream, if needed.  Ask your doctor about medicine for children. GET HELP RIGHT AWAY IF:  You have open sores.  Redness spreads beyond the area of the rash.  There is yellowish white fluid (pus) coming from the rash.  Pain gets worse.  You have a temperature by mouth above 102 F (38.9 C), not controlled by medicine. MAKE SURE YOU:  Understand these instructions.  Will watch your condition.  Will get help right away if you are not doing well or get worse.   This information is not intended to replace advice given to you by your health care provider. Make sure you discuss any questions you have with your health care provider.   Document Released: 07/23/2010 Document Revised: 09/12/2011 Document Reviewed: 11/26/2014 Elsevier Interactive Patient Education 2016 [place] may be poisonous (venomous) or nonpoisonous (nonvenomous). A bite from a nonvenomous snake may cause a wound to the skin and possibly to the deeper tissues beneath the skin. A venomous snake will cause a wound and may also inject poison (venom) into the wound.  The effects of snake venom vary depending on the type of snake. In some cases, the effects can be extremely  serious or even deadly. A bite from a venomous snake is a medical emergency. Treatment may require the use of antivenom medicine. SYMPTOMS  Symptoms of a snake bite vary depending on the type of snake, whether the snake is venomous, and the severity of the bite. Symptoms for both a venomous or nonvenomous snake may include:   Pain, redness, and swelling at the site of the bite.  Skin discoloration at the site of the bite.   A feeling of nervousness. Symptoms of a venomous snake bite may also include:   Increasing pain and swelling.  Severe anxiety or confusion.  Blood blisters or purple spots in the bite area.   Nausea and vomiting.   Numbness or tingling.   Muscle weakness.   Excessive fatigue or drowsiness.  Excessive sweating.   Difficulty breathing.   Blurred vision.   Bruising and bleeding at the site of the bite.  Feeling faint or light-headed. In some cases, symptoms do not develop until a few hours after the bite.  DIAGNOSIS  This condition may be diagnosed based on symptoms and a physical exam. Your health care provider will examine the bite area and ask for details about the snake to help determine whether it is venomous. You may also have tests, including blood tests. TREATMENT  Treatment depends on the severity of the bite and whether the snake is venomous.  Treatment for nonvenomous snake bites may involve basic wound care. This often includes cleaning the  wound and applying a bandage (dressing). In some cases, antibiotic medicine or a tetanus shot may be given.  Treatment for venomous snake bites may include antivenom medicine in addition to wound care. This medicine needs to be given as soon as possible after the bite. Other treatments may be needed to help control symptoms as they develop. You may need to stay in a hospital so your condition can be monitored. HOME CARE INSTRUCTIONS  Wound Care  Follow instructions from your health care provider  about how to take care of your wound. Make sure you:   Wash your hands with soap and water before you change your dressing. If soap and water are not available, use hand sanitizer.  Change your dressing as told by your health care provider.  Keep the bite area clean and dry. Wash the bite area daily with soap and water or an antiseptic as told by your health care provider.  Check your wound every day for signs of infection. Watch for:   Redness, swelling, or pain that is getting worse.   Fluid, blood, or pus.   If you develop blistering at the site of the bite, protect the blisters from breaking. Do not attempt to open a blister. Medicines  Take or apply over-the-counter and prescription medicines only as told by your health care provider.   If you were prescribed an antibiotic, take or apply it as told by your health care provider. Do not stop using the antibiotic even if your condition improves. General Instructions  Keep the affected area raised (elevated) above the level of your heart while you are sitting or lying down, if possible.  Keep all follow-up visits as told by your health care provider. This is important. SEEK MEDICAL CARE IF:   You have increased redness, swelling, or pain at the site of your wound.  You have fluid, blood, or pus coming from your wound.  You have a fever. SEEK IMMEDIATE MEDICAL CARE IF:   You develop blood blisters or purple spots in the bite area.   You have nausea or vomiting.   You have numbness or tingling.   You have excessive sweating.   You have trouble breathing.   You have vision problems.   You feel very confused.  You feel faint or light-headed.    This information is not intended to replace advice given to you by your health care provider. Make sure you discuss any questions you have with your health care provider.   Document Released: 06/17/2000 Document Revised: 03/11/2015 Document Reviewed:  11/05/2014 Elsevier Interactive Patient Education [employer].

## 2015-09-30 NOTE — Progress Notes (Signed)
S: c/o rash on leg, ?if was spider or snake bite, been there several days, happened after working in woods, then later started with raised streaks on leg, no fever/chills/cramps  O: vitals wnl, nad, skin with 2 puncture wounds on lateral side of left knee, area is minimally red, no increased warms, thigh has raised streaks typical of contact dermatitis, no drainage, n/v intact  A: contact dermatitis, snake bite  P: decadron 10mg  IM, if area is getting more red at puncture site then will call in antibiotic, pt does not exhibit symptoms of poisonous snake bite

## 2016-01-21 ENCOUNTER — Encounter: Payer: Self-pay | Admitting: Internal Medicine

## 2016-01-22 ENCOUNTER — Encounter: Payer: Self-pay | Admitting: Internal Medicine

## 2016-01-22 ENCOUNTER — Other Ambulatory Visit: Payer: Self-pay | Admitting: Internal Medicine

## 2016-01-22 MED ORDER — ONDANSETRON HCL 8 MG PO TABS: 8.0000 mg | ORAL_TABLET | Freq: Three times a day (TID) | ORAL | Status: DC | PRN

## 2016-02-08 ENCOUNTER — Ambulatory Visit: Payer: Self-pay | Admitting: Internal Medicine

## 2016-03-02 ENCOUNTER — Encounter: Payer: Self-pay | Admitting: Internal Medicine

## 2016-03-02 ENCOUNTER — Ambulatory Visit (INDEPENDENT_AMBULATORY_CARE_PROVIDER_SITE_OTHER): Payer: Managed Care, Other (non HMO) | Admitting: Internal Medicine

## 2016-03-02 VITALS — BP 108/68 | HR 116 | Temp 98.2°F | Resp 12 | Ht 66.0 in | Wt 167.0 lb

## 2016-03-02 DIAGNOSIS — F411 Generalized anxiety disorder: Secondary | ICD-10-CM

## 2016-03-02 DIAGNOSIS — K589 Irritable bowel syndrome without diarrhea: Secondary | ICD-10-CM | POA: Diagnosis not present

## 2016-03-02 DIAGNOSIS — R5383 Other fatigue: Secondary | ICD-10-CM | POA: Diagnosis not present

## 2016-03-02 DIAGNOSIS — E785 Hyperlipidemia, unspecified: Secondary | ICD-10-CM

## 2016-03-02 DIAGNOSIS — E559 Vitamin D deficiency, unspecified: Secondary | ICD-10-CM

## 2016-03-02 DIAGNOSIS — Z0000 Encounter for general adult medical examination without abnormal findings: Secondary | ICD-10-CM | POA: Diagnosis not present

## 2016-03-02 DIAGNOSIS — R5382 Chronic fatigue, unspecified: Secondary | ICD-10-CM

## 2016-03-02 DIAGNOSIS — G9332 Myalgic encephalomyelitis/chronic fatigue syndrome: Secondary | ICD-10-CM

## 2016-03-02 MED ORDER — LACTULOSE 20 GM/30ML PO SOLN: ORAL | 3 refills | Status: DC

## 2016-03-02 MED ORDER — HYOSCYAMINE SULFATE 0.125 MG SL SUBL: 0.1250 mg | SUBLINGUAL_TABLET | SUBLINGUAL | 3 refills | Status: DC | PRN

## 2016-03-02 NOTE — Patient Instructions (Addendum)
Try the Glycerin suppositories for constipation   Try taking a stool softener at night Colace (docusate) 100 to 200 capsules at bedtime  You can use 20 mg famotidine twice daily or 150 mg ranitidine twice daily  instead of Nexium or other PPI    Mission Whole Wheat low Carb Tortilla  Has 26 g fiber !!!! (See picture below )    To make a low carb chip :  Take the Joseph's Lavash or Pita bread,  Or the Mission Low carb whole wheat tortilla   Place on metal cookie sheet  Brush with olive oil  Sprinkle garlic powder (NOT garlic salt), grated parmesan cheese, mediterranean seasoning , or all of them!  Bake at 275 for 30 minutes   We have substitutions for your potatoes!!  Try the mashed cauliflower and riced cauliflower dishes instead of rice and mashed potatoes by [employer] (don't forget your Beano)   Mashed turnips are also very low carb!   For desserts :  Try the Dannon Lt n Fit greek yogurt dessert flavors and top with reddi Whip .  8 carbs,  80 calories  Try Oikos Triple Zero [place] Yogurt in the salted caramel, and the coffee flavors  With Whipped Cream for dessert  breyer's low carb ice cream, available in bars (on a stick, better ) or scoopable ice cream  HERE ARE THE LOW CARB  BREAD CHOICES        Menopause is a normal process in which your reproductive ability comes to an end. This process happens gradually over a span of months to years, usually between the ages of 68 and 88. Menopause is complete when you have missed 12 consecutive menstrual periods. It is important to talk with your health care provider about some of the most common conditions that affect postmenopausal women, such as heart disease, cancer, and bone loss (osteoporosis). Adopting a healthy lifestyle and getting preventive care can help to promote your health and wellness. Those actions can also lower your chances of developing some of these common conditions. WHAT SHOULD I KNOW ABOUT  MENOPAUSE? During menopause, you may experience a number of symptoms, such as:  Moderate-to-severe hot flashes.  Night sweats.  Decrease in sex drive.  Mood swings.  Headaches.  Tiredness.  Irritability.  Memory problems.  Insomnia. Choosing to treat or not to treat menopausal changes is an individual decision that you make with your health care provider. WHAT SHOULD I KNOW ABOUT HORMONE REPLACEMENT THERAPY AND SUPPLEMENTS? Hormone therapy products are effective for treating symptoms that are associated with menopause, such as hot flashes and night sweats. Hormone replacement carries certain risks, especially as you become older. If you are thinking about using estrogen or estrogen with progestin treatments, discuss the benefits and risks with your health care provider. WHAT SHOULD I KNOW ABOUT HEART DISEASE AND STROKE? Heart disease, heart attack, and stroke become more likely as you age. This may be due, in part, to the hormonal changes that your body experiences during menopause. These can affect how your body processes dietary fats, triglycerides, and cholesterol. Heart attack and stroke are both medical emergencies. There are many things that you can do to help prevent heart disease and stroke:  Have your blood pressure checked at least every 1-2 years. High blood pressure causes heart disease and increases the risk of stroke.  If you are 43-52 years old, ask your health care provider if you should take aspirin to prevent a heart attack or a stroke.  Do not use any tobacco products, including cigarettes, chewing tobacco, or electronic cigarettes. If you need help quitting, ask your health care provider.  It is important to eat a healthy diet and maintain a healthy weight.  Be sure to include plenty of vegetables, fruits, low-fat dairy products, and lean protein.  Avoid eating foods that are high in solid fats, added sugars, or salt (sodium).  Get regular exercise. This is  one of the most important things that you can do for your health.  Try to exercise for at least 150 minutes each week. The type of exercise that you do should increase your heart rate and make you sweat. This is known as moderate-intensity exercise.  Try to do strengthening exercises at least twice each week. Do these in addition to the moderate-intensity exercise.  Know your numbers.Ask your health care provider to check your cholesterol and your blood glucose. Continue to have your blood tested as directed by your health care provider. WHAT SHOULD I KNOW ABOUT CANCER SCREENING? There are several types of cancer. Take the following steps to reduce your risk and to catch any cancer development as early as possible. Breast Cancer  Practice breast self-awareness.  This means understanding how your breasts normally appear and feel.  It also means doing regular breast self-exams. Let your health care provider know about any changes, no matter how small.  If you are 30 or older, have a clinician do a breast exam (clinical breast exam or CBE) every year. Depending on your age, family history, and medical history, it may be recommended that you also have a yearly breast X-ray (mammogram).  If you have a family history of breast cancer, talk with your health care provider about genetic screening.  If you are at high risk for breast cancer, talk with your health care provider about having an MRI and a mammogram every year.  Breast cancer (BRCA) gene test is recommended for women who have family members with BRCA-related cancers. Results of the assessment will determine the need for genetic counseling and BRCA1 and for BRCA2 testing. BRCA-related cancers include these types:  Breast. This occurs in males or females.  Ovarian.  Tubal. This may also be called fallopian tube cancer.  Cancer of the abdominal or pelvic lining (peritoneal cancer).  Prostate.  Pancreatic. Cervical, Uterine, and  Ovarian Cancer Your health care provider may recommend that you be screened regularly for cancer of the pelvic organs. These include your ovaries, uterus, and vagina. This screening involves a pelvic exam, which includes checking for microscopic changes to the surface of your cervix (Pap test).  For women ages 21-65, health care providers may recommend a pelvic exam and a Pap test every three years. For women ages 64-65, they may recommend the Pap test and pelvic exam, combined with testing for human papilloma virus (HPV), every five years. Some types of HPV increase your risk of cervical cancer. Testing for HPV may also be done on women of any age who have unclear Pap test results.  Other health care providers may not recommend any screening for nonpregnant women who are considered low risk for pelvic cancer and have no symptoms. Ask your health care provider if a screening pelvic exam is right for you.  If you have had past treatment for cervical cancer or a condition that could lead to cancer, you need Pap tests and screening for cancer for at least 20 years after your treatment. If Pap tests have been discontinued for you,  your risk factors (such as having a new sexual partner) need to be reassessed to determine if you should start having screenings again. Some women have medical problems that increase the chance of getting cervical cancer. In these cases, your health care provider may recommend that you have screening and Pap tests more often.  If you have a family history of uterine cancer or ovarian cancer, talk with your health care provider about genetic screening.  If you have vaginal bleeding after reaching menopause, tell your health care provider.  There are currently no reliable tests available to screen for ovarian cancer. Lung Cancer Lung cancer screening is recommended for adults 61-21 years old who are at high risk for lung cancer because of a history of smoking. A yearly low-dose CT  scan of the lungs is recommended if you:  Currently smoke.  Have a history of at least 30 pack-years of smoking and you currently smoke or have quit within the past 15 years. A pack-year is smoking an average of one pack of cigarettes per day for one year. Yearly screening should:  Continue until it has been 15 years since you quit.  Stop if you develop a health problem that would prevent you from having lung cancer treatment. Colorectal Cancer  This type of cancer can be detected and can often be prevented.  Routine colorectal cancer screening usually begins at age 4 and continues through age 54.  If you have risk factors for colon cancer, your health care provider may recommend that you be screened at an earlier age.  If you have a family history of colorectal cancer, talk with your health care provider about genetic screening.  Your health care provider may also recommend using home test kits to check for hidden blood in your stool.  A small camera at the end of a tube can be used to examine your colon directly (sigmoidoscopy or colonoscopy). This is done to check for the earliest forms of colorectal cancer.  Direct examination of the colon should be repeated every 5-10 years until age 62. However, if early forms of precancerous polyps or small growths are found or if you have a family history or genetic risk for colorectal cancer, you may need to be screened more often. Skin Cancer  Check your skin from head to toe regularly.  Monitor any moles. Be sure to tell your health care provider:  About any new moles or changes in moles, especially if there is a change in a mole's shape or color.  If you have a mole that is larger than the size of a pencil eraser.  If any of your family members has a history of skin cancer, especially at a young age, talk with your health care provider about genetic screening.  Always use sunscreen. Apply sunscreen liberally and repeatedly throughout  the day.  Whenever you are outside, protect yourself by wearing long sleeves, pants, a wide-brimmed hat, and sunglasses. WHAT SHOULD I KNOW ABOUT OSTEOPOROSIS? Osteoporosis is a condition in which bone destruction happens more quickly than new bone creation. After menopause, you may be at an increased risk for osteoporosis. To help prevent osteoporosis or the bone fractures that can happen because of osteoporosis, the following is recommended:  If you are 4-40 years old, get at least 1,000 mg of calcium and at least 600 mg of vitamin D per day.  If you are older than age 67 but younger than age 73, get at least 1,200 mg of calcium and  at least 600 mg of vitamin D per day.  If you are older than age 75, get at least 1,200 mg of calcium and at least 800 mg of vitamin D per day. Smoking and excessive alcohol intake increase the risk of osteoporosis. Eat foods that are rich in calcium and vitamin D, and do weight-bearing exercises several times each week as directed by your health care provider. WHAT SHOULD I KNOW ABOUT HOW MENOPAUSE AFFECTS [place]? Depression may occur at any age, but it is more common as you become older. Common symptoms of depression include:  Low or sad mood.  Changes in sleep patterns.  Changes in appetite or eating patterns.  Feeling an overall lack of motivation or enjoyment of activities that you previously enjoyed.  Frequent crying spells. Talk with your health care provider if you think that you are experiencing depression. WHAT SHOULD I KNOW ABOUT IMMUNIZATIONS? It is important that you get and maintain your immunizations. These include:  Tetanus, diphtheria, and pertussis (Tdap) booster vaccine.  Influenza every year before the flu season begins.  Pneumonia vaccine.  Shingles vaccine. Your health care provider may also recommend other immunizations.   This information is not intended to replace advice given to you by your health care provider.  Make sure you discuss any questions you have with your health care provider.   Document Released: 08/12/2005 Document Revised: 07/11/2014 Document Reviewed: 02/20/2014 Elsevier Interactive Patient Education [employer].

## 2016-03-02 NOTE — Progress Notes (Signed)
Pre-visit discussion using our clinic review tool. No additional management support is needed unless otherwise documented below in the visit note.  

## 2016-03-02 NOTE — Progress Notes (Signed)
Patient ID: Brandi Douglas, female    DOB: 21-Oct-1950  Age: 65 y.o. MRN: [id number]  The patient is here for annual  Non gyn examination and management of other chronic and acute problems.  Breast and pelvic done at [facility]. Last PAP 2015  getting flu vaccine elsewhere DEXA year ago + Colonoscopy 2011  Moh's procedure left forehead  Nov 205 for [facility] .  Sees [place]  Using sun screen of 30 while outside  Does gardening work  At [employer] and volunteering:   Software engineer in process   Bowel of cereal 8:30     The risk factors are reflected in the social history.  The roster of all physicians providing medical care to patient - is listed in the Snapshot section of the chart.  Home safety : The patient has smoke detectors in the home. They wear seatbelts.  There are no firearms at home. There is no violence in the home.   There is no risks for hepatitis, STDs or HIV. There is no   history of blood transfusion. They have no travel history to infectious disease endemic areas of the world.  The patient has seen their dentist in the last six month. They have seen their eye doctor in the last year.    They do not  have excessive sun exposure. Discussed the need for sun protection: hats, long sleeves and use of sunscreen if there is significant sun exposure.   Diet: the importance of a healthy diet is discussed. They do have a healthy diet.  The benefits of regular aerobic exercise were discussed. She walks 4 times per week ,  20 minutes.   Depression screen: there are no signs or vegative symptoms of depression- irritability, change in appetite, anhedonia, sadness/tearfullness.  The following portions of the patient's history were reviewed and updated as appropriate: allergies, current medications, past family history, past medical history,  past surgical history, past social history  and problem list.  Visual acuity was not assessed per patient preference since she has regular  follow up with her ophthalmologist. Hearing and body mass index were assessed and reviewed.   During the course of the visit the patient was educated and counseled about appropriate screening and preventive services including : fall prevention , diabetes screening, nutrition counseling, colorectal cancer screening, and recommended immunizations.    CC: The primary encounter diagnosis was Other fatigue. Diagnoses of Vitamin D deficiency, Hyperlipidemia, Irritable bowel syndrome (IBS), Visit for preventive health examination, Chronic fatigue syndrome, and Generalized anxiety disorder were also pertinent to this visit.   IBS acting up.  Off Nexium due to nausea,  Using pepcid ac and tums for gastritis [employer]IPATION.  Less diarrhea since retired .  Discussed linzess,  Lactulose , glycerin suppositories .Gets nauseated with bowel movements.  Uses zofran for this. levsin prn cramping, needs refill   History Brandi Douglas has a past medical history of Chronic fatigue syndrome; Generalized anxiety disorder; and Irritable bowel syndrome.   She has a past surgical history that includes Cholecystectomy.   Her family history is not on file.She reports that she has never smoked. She has never used smokeless tobacco. She reports that she does not drink alcohol or use drugs.  Outpatient Medications Prior to Visit  Medication Sig Dispense Refill  . ALPRAZolam (XANAX) 0.5 MG tablet Take 0.5-1 tablets (0.25-0.5 mg total) by mouth 2 (two) times daily as needed. For anxiety 30 tablet 5  . Lisdexamfetamine Dimesylate (VYVANSE) 10 MG CAPS Take  1 capsule by mouth daily.    . ondansetron (ZOFRAN) 8 MG tablet Take 1 tablet (8 mg total) by mouth every 8 (eight) hours as needed. For nausea 20 tablet 1  . ondansetron (ZOFRAN) 8 MG tablet Take 1 tablet (8 mg total) by mouth every 8 (eight) hours as needed. For nausea 20 tablet 4  . PARoxetine (PAXIL) 20 MG tablet TAKE one and one-half TABLET EACH DAY    . SUMAtriptan  (IMITREX) 100 MG tablet TAKE 1 TABLET BY MOUTH AT ONSET OF HEADACHE. MAY TAKE AN ADDITIONAL TABLET IN 2 HOURS IF NEEDED. MAX 2/24hr. 10 tablet 3  . tolterodine (DETROL LA) 4 MG 24 hr capsule Take 4 mg by mouth daily.    . hyoscyamine (LEVSIN SL) 0.125 MG SL tablet Place 1 tablet (0.125 mg total) under the tongue every 4 (four) hours as needed for cramping. 30 tablet 3  . esomeprazole (NEXIUM) 40 MG capsule TAKE ONE (1) CAPSULE EACH DAY BEFORE BREAKFAST (Patient not taking: Reported on 03/02/2016) 30 capsule 5   No facility-administered medications prior to visit.     Review of Systems   Patient denies headache, fevers, malaise, unintentional weight loss, skin rash, eye pain, sinus congestion and sinus pain, sore throat, dysphagia,  hemoptysis , cough, dyspnea, wheezing, chest pain, palpitations, orthopnea, edema,  melena, diarrhea,  flank pain, dysuria, hematuria, urinary  Frequency, nocturia, numbness, tingling, seizures,  Focal weakness, Loss of consciousness,  Tremor, insomnia, depression, anxiety, and suicidal ideation.      Objective:  BP 108/68   Pulse (!) 116   Temp 98.2 F (36.8 C) (Oral)   Resp 12   Ht _0  (1.676 m)   Wt 167 lb (75.8 kg)   SpO2 98%   BMI 26.95 kg/m   Physical Exam   General appearance: alert, cooperative and appears stated age Head: Normocephalic, without obvious abnormality, atraumatic Eyes: conjunctivae/corneas clear. PERRL, EOM's intact. Fundi benign. Ears: normal TM's and external ear canals both ears Nose: Nares normal. Septum midline. Mucosa normal. No drainage or sinus tenderness. Throat: lips, mucosa, and tongue normal; teeth and gums normal Neck: no adenopathy, no carotid bruit, no JVD, supple, symmetrical, trachea midline and thyroid not enlarged, symmetric, no tenderness/mass/nodules Lungs: clear to auscultation bilateraBreasts: normal appearance, no masses or tenderness Heart: regular rate and rhythm, S1, S2 normal, no murmur, click, rub or  gallop Abdomen: soft, non-tender; bowel sounds normal; no masses,  no organomegaly Extremities: extremities normal, atraumatic, no cyanosis or edema Pulses: 2+ and symmetric Skin: Skin color, texture, turgor normal. No rashes or lesions Neurologic: Alert and oriented X 3, normal strength and tone. Normal symmetric reflexes. Normal coordination and gait.     Assessment & Plan:   Problem List Items Addressed This Visit    Chronic fatigue syndrome    Managed by Dr Nicolasa Ducking with Vyvanse      Generalized anxiety disorder    Managed by Dr Nicolasa Ducking with Paxil       Irritable bowel syndrome (IBS)    Now constipation predominant with nausea occurring with BMs.  Glycerin suppositories added.  Dexilant has been disconitnued,  Trial of H2 blocker bid,  Continue Bentyl and Levsin        Relevant Medications   Lactulose 20 GM/30ML SOLN   hyoscyamine (LEVSIN SL) 0.125 MG SL tablet   Visit for preventive health examination    Annual comprehensive preventive exam was done as well as an evaluation and management of chronic conditions .  During the  course of the visit the patient was educated and counseled about appropriate screening and preventive services including :  diabetes screening, lipid analysis with projected  10 year  risk for CAD , nutrition counseling, breast, cervical and colorectal cancer screening, and recommended immunizations.  Printed recommendations for health maintenance screenings was given.       Other Visit Diagnoses    Other fatigue    -  Primary   Relevant Orders   TSH   Comp Met (CMET)   Vitamin D deficiency       Relevant Orders   VITAMIN D 25 Hydroxy (Vit-D Deficiency, Fractures)   Hyperlipidemia       Relevant Orders   Lipid panel      I have discontinued Ms. Hirschman's esomeprazole. I am also having her start on Lactulose. Additionally, I am having her maintain her tolterodine, ondansetron, ALPRAZolam, Lisdexamfetamine Dimesylate, PARoxetine, SUMAtriptan,  ondansetron, and hyoscyamine.  Meds ordered this encounter  Medications  . Lactulose 20 GM/30ML SOLN    Sig: 30 ml every 4 hours until constipation is relieved    Dispense:  236 mL    Refill:  3    DO NOT FILL;  KEEP ON FILE FOR FUTURE USE  . hyoscyamine (LEVSIN SL) 0.125 MG SL tablet    Sig: Place 1 tablet (0.125 mg total) under the tongue every 4 (four) hours as needed for cramping.    Dispense:  30 tablet    Refill:  3    Medications Discontinued During This Encounter  Medication Reason  . hyoscyamine (LEVSIN SL) 0.125 MG SL tablet Reorder  . esomeprazole (NEXIUM) 40 MG capsule     Follow-up: Return for fasiting labs asap.   Crecencio Mc, MD

## 2016-03-05 NOTE — Assessment & Plan Note (Signed)
Now constipation predominant with nausea occurring with BMs.  Glycerin suppositories added.  Dexilant has been disconitnued,  Trial of H2 blocker bid,  Continue Bentyl and Levsin

## 2016-03-05 NOTE — Assessment & Plan Note (Signed)
Managed by Dr Nicolasa Ducking with Paxil

## 2016-03-05 NOTE — Assessment & Plan Note (Signed)
Managed by Dr Nicolasa Ducking with Vyvanse

## 2016-03-05 NOTE — Assessment & Plan Note (Addendum)
Annual comprehensive preventive exam was done as well as an evaluation and management of chronic conditions .  During the course of the visit the patient was educated and counseled about appropriate screening and preventive services including :  diabetes screening, lipid analysis with projected  10 year  risk for CAD , nutrition counseling, breast, cervical and colorectal cancer screening, and recommended immunizations.  Printed recommendations for health maintenance screenings was given 

## 2016-03-08 ENCOUNTER — Other Ambulatory Visit: Payer: Self-pay

## 2016-03-17 ENCOUNTER — Other Ambulatory Visit (INDEPENDENT_AMBULATORY_CARE_PROVIDER_SITE_OTHER): Payer: Managed Care, Other (non HMO)

## 2016-03-17 DIAGNOSIS — E559 Vitamin D deficiency, unspecified: Secondary | ICD-10-CM

## 2016-03-17 DIAGNOSIS — R5383 Other fatigue: Secondary | ICD-10-CM | POA: Diagnosis not present

## 2016-03-17 DIAGNOSIS — E785 Hyperlipidemia, unspecified: Secondary | ICD-10-CM | POA: Diagnosis not present

## 2016-03-17 LAB — COMPREHENSIVE METABOLIC PANEL
ALT: 13 U/L (ref 0–35)
AST: 15 U/L (ref 0–37)
Albumin: 4.3 g/dL (ref 3.5–5.2)
Alkaline Phosphatase: 80 U/L (ref 39–117)
BUN: 13 mg/dL (ref 6–23)
CALCIUM: 9.2 mg/dL (ref 8.4–10.5)
CO2: 28 meq/L (ref 19–32)
Chloride: 107 mEq/L (ref 96–112)
Creatinine, Ser: 0.78 mg/dL (ref 0.40–1.20)
GFR: 79.09 mL/min (ref >60.00)
GLUCOSE: 92 mg/dL (ref 70–99)
Potassium: 4.1 mEq/L (ref 3.5–5.1)
Sodium: 140 mEq/L (ref 135–145)
Total Bilirubin: 0.5 mg/dL (ref 0.2–1.2)
Total Protein: 6.8 g/dL (ref 6.0–8.3)

## 2016-03-17 LAB — LIPID PANEL
CHOLESTEROL: 173 mg/dL (ref 0–200)
HDL: 52.8 mg/dL (ref >39.00)
LDL CALC: 95 mg/dL (ref 0–99)
NonHDL: 119.87
TRIGLYCERIDES: 124 mg/dL (ref 0.0–149.0)
Total CHOL/HDL Ratio: 3
VLDL: 24.8 mg/dL (ref 0.0–40.0)

## 2016-03-17 LAB — VITAMIN D 25 HYDROXY (VIT D DEFICIENCY, FRACTURES): VITD: 24.08 ng/mL — ABNORMAL LOW (ref 30.00–100.00)

## 2016-03-17 LAB — TSH: TSH: 1.92 u[IU]/mL (ref 0.35–4.50)

## 2016-03-20 ENCOUNTER — Encounter: Payer: Self-pay | Admitting: Internal Medicine

## 2016-04-05 ENCOUNTER — Ambulatory Visit: Payer: Self-pay | Admitting: Physician Assistant

## 2016-04-05 ENCOUNTER — Encounter: Payer: Self-pay | Admitting: Physician Assistant

## 2016-04-05 VITALS — BP 119/70 | HR 104 | Temp 98.3°F

## 2016-04-05 DIAGNOSIS — R509 Fever, unspecified: Secondary | ICD-10-CM

## 2016-04-05 DIAGNOSIS — R252 Cramp and spasm: Secondary | ICD-10-CM

## 2016-04-05 LAB — POCT URINALYSIS DIPSTICK
BILIRUBIN UA: NEGATIVE
GLUCOSE UA: NEGATIVE
KETONES UA: NEGATIVE
Nitrite, UA: NEGATIVE
PH UA: 6
Protein, UA: NEGATIVE
Spec Grav, UA: <=1.005
Urobilinogen, UA: 1

## 2016-04-05 MED ORDER — AMOXICILLIN 875 MG PO TABS: 875.0000 mg | ORAL_TABLET | Freq: Two times a day (BID) | ORAL | 0 refills | Status: DC

## 2016-04-05 MED ORDER — FLUCONAZOLE 150 MG PO TABS: 150.0000 mg | ORAL_TABLET | Freq: Once | ORAL | 0 refills | Status: DC

## 2016-04-05 MED ORDER — FLUCONAZOLE 150 MG PO TABS: 150.0000 mg | ORAL_TABLET | Freq: Once | ORAL | 0 refills | Status: AC

## 2016-04-05 NOTE — Progress Notes (Signed)
S: c/o hurting all over, worked in the yard a little, now has been hurting all over, did have some fever/chills, no cough or congestion, no known tick bite, no v/d; states she has chronic fatigue, is taking paxil and vyvanse for this, sees dr Nicolasa Ducking; states when she overdoes it she hurts but this is worse than normal  O: vitals wnl, nad, skin intact , no redness or bruising, lungs c t a, cv rrr, ua trace leuks  A: muscle aches  P: concerns of lymes, labs drawn today, amoxil 875mg  bid for 10d, diflucan if needed

## 2016-04-07 LAB — CBC WITH DIFFERENTIAL/PLATELET
BASOS ABS: 0 10*3/uL (ref 0.0–0.2)
Basos: 0 %
EOS (ABSOLUTE): 0.1 10*3/uL (ref 0.0–0.4)
Eos: 1 %
Hematocrit: 37.4 % (ref 34.0–46.6)
Hemoglobin: 13 g/dL (ref 11.1–15.9)
IMMATURE GRANULOCYTES: 0 %
Immature Grans (Abs): 0 10*3/uL (ref 0.0–0.1)
LYMPHS ABS: 1.5 10*3/uL (ref 0.7–3.1)
Lymphs: 23 %
MCH: 31.4 pg (ref 26.6–33.0)
MCHC: 34.8 g/dL (ref 31.5–35.7)
MCV: 90 fL (ref 79–97)
MONOS ABS: 0.6 10*3/uL (ref 0.1–0.9)
Monocytes: 8 %
NEUTROS PCT: 68 %
Neutrophils Absolute: 4.7 10*3/uL (ref 1.4–7.0)
PLATELETS: 179 10*3/uL (ref 150–379)
RBC: 4.14 x10E6/uL (ref 3.77–5.28)
RDW: 12.8 % (ref 12.3–15.4)
WBC: 6.8 10*3/uL (ref 3.4–10.8)

## 2016-04-07 LAB — BASIC METABOLIC PANEL
BUN/Creatinine Ratio: 13 (ref 12–28)
BUN: 10 mg/dL (ref 8–27)
CO2: 26 mmol/L (ref 18–29)
Calcium: 9.8 mg/dL (ref 8.7–10.3)
Chloride: 99 mmol/L (ref 96–106)
Creatinine, Ser: 0.75 mg/dL (ref 0.57–1.00)
GFR calc Af Amer: 98 mL/min/{1.73_m2} (ref >59)
GFR calc non Af Amer: 85 mL/min/{1.73_m2} (ref >59)
GLUCOSE: 99 mg/dL (ref 65–99)
POTASSIUM: 4.1 mmol/L (ref 3.5–5.2)
SODIUM: 140 mmol/L (ref 134–144)

## 2016-04-07 LAB — B. BURGDORFI ANTIBODIES: Lyme IgG/IgM Ab: <0.91 {ISR} (ref 0.00–0.90)

## 2016-04-07 LAB — B12 AND FOLATE PANEL
Folate: >20 ng/mL (ref >3.0)
Vitamin B-12: 595 pg/mL (ref 211–946)

## 2016-04-07 LAB — CK: CK TOTAL: 58 U/L (ref 24–173)

## 2016-04-07 LAB — URINE CULTURE: ORGANISM ID, BACTERIA: NO GROWTH

## 2016-04-11 ENCOUNTER — Encounter: Payer: Self-pay | Admitting: Physician Assistant

## 2016-04-12 ENCOUNTER — Encounter: Payer: Self-pay | Admitting: Physician Assistant

## 2016-04-18 ENCOUNTER — Encounter: Payer: Self-pay | Admitting: Physician Assistant

## 2016-04-19 ENCOUNTER — Ambulatory Visit: Payer: Self-pay | Admitting: Physician Assistant

## 2016-04-19 ENCOUNTER — Encounter: Payer: Self-pay | Admitting: Physician Assistant

## 2016-04-19 VITALS — BP 136/75 | HR 97 | Temp 98.5°F

## 2016-04-19 DIAGNOSIS — R5382 Chronic fatigue, unspecified: Secondary | ICD-10-CM

## 2016-04-19 MED ORDER — METHYLPREDNISOLONE 4 MG PO TBPK: ORAL_TABLET | ORAL | 0 refills | Status: DC

## 2016-04-19 NOTE — Progress Notes (Signed)
S: states not feeling a lot better, muscles and joints still ache, didn't take but 3 days of the amoxil and it hurt her stomach, no fever/chills, just really tired and fatigued  O vitals wnl, nad, lungs c ta , cv rrr, joints are a little swollen at mip and pip of both hands b/l, full rom, n/v intact  A: chronic fatigue  P: medrol dose pack, follow up with pcp or chronic fatigue doctor

## 2016-05-17 ENCOUNTER — Ambulatory Visit: Payer: Self-pay | Admitting: Physician Assistant

## 2016-05-17 DIAGNOSIS — Z299 Encounter for prophylactic measures, unspecified: Secondary | ICD-10-CM

## 2016-05-17 NOTE — Progress Notes (Signed)
Patient came in to get her scheduled flu vaccine.

## 2016-07-05 ENCOUNTER — Other Ambulatory Visit: Payer: Self-pay | Admitting: Internal Medicine

## 2016-07-05 ENCOUNTER — Encounter: Payer: Self-pay | Admitting: Internal Medicine

## 2016-07-06 MED ORDER — SUMATRIPTAN SUCCINATE 100 MG PO TABS: ORAL_TABLET | ORAL | 3 refills | Status: DC

## 2016-07-06 NOTE — Telephone Encounter (Signed)
Pended medication ok to fill last OV 8/17

## 2016-07-06 NOTE — Telephone Encounter (Signed)
rx sent to [facility]

## 2016-07-06 NOTE — Telephone Encounter (Signed)
Last filled 05/26/15. Originally written 07/03/14. Last OV 03/02/16. No scheduled follow up.

## 2016-07-06 NOTE — Telephone Encounter (Signed)
Refilled losartan  for 30 days only.  Will combine with hctz in 30 days if tolerating combination and bp at goal of 120/70/

## 2017-03-03 ENCOUNTER — Encounter: Payer: Self-pay | Admitting: Internal Medicine

## 2017-03-18 LAB — HM PAP SMEAR: HM PAP: NORMAL

## 2017-04-12 ENCOUNTER — Telehealth: Payer: Self-pay | Admitting: Internal Medicine

## 2017-04-12 DIAGNOSIS — Z79899 Other long term (current) drug therapy: Secondary | ICD-10-CM

## 2017-04-12 DIAGNOSIS — R5382 Chronic fatigue, unspecified: Secondary | ICD-10-CM

## 2017-04-12 DIAGNOSIS — E785 Hyperlipidemia, unspecified: Secondary | ICD-10-CM

## 2017-04-12 DIAGNOSIS — G9332 Myalgic encephalomyelitis/chronic fatigue syndrome: Secondary | ICD-10-CM

## 2017-04-12 NOTE — Telephone Encounter (Signed)
Pt called wanting to know if she needs to have labs done before her appt on 10/15. Please advise, thank you!  Call pt @ [phone]

## 2017-04-12 NOTE — Telephone Encounter (Signed)
Labs have been ordered and pt has been scheduled a lab appt. Pt is aware of appt date and time.

## 2017-04-14 ENCOUNTER — Other Ambulatory Visit (INDEPENDENT_AMBULATORY_CARE_PROVIDER_SITE_OTHER): Payer: Managed Care, Other (non HMO)

## 2017-04-14 DIAGNOSIS — R5382 Chronic fatigue, unspecified: Secondary | ICD-10-CM | POA: Diagnosis not present

## 2017-04-14 DIAGNOSIS — G9332 Myalgic encephalomyelitis/chronic fatigue syndrome: Secondary | ICD-10-CM

## 2017-04-14 DIAGNOSIS — E785 Hyperlipidemia, unspecified: Secondary | ICD-10-CM | POA: Diagnosis not present

## 2017-04-14 DIAGNOSIS — Z79899 Other long term (current) drug therapy: Secondary | ICD-10-CM | POA: Diagnosis not present

## 2017-04-14 LAB — COMPREHENSIVE METABOLIC PANEL
ALBUMIN: 4.3 g/dL (ref 3.5–5.2)
ALK PHOS: 85 U/L (ref 39–117)
ALT: 16 U/L (ref 0–35)
AST: 16 U/L (ref 0–37)
BILIRUBIN TOTAL: 0.5 mg/dL (ref 0.2–1.2)
BUN: 13 mg/dL (ref 6–23)
CALCIUM: 9.1 mg/dL (ref 8.4–10.5)
CO2: 27 mEq/L (ref 19–32)
CREATININE: 0.81 mg/dL (ref 0.40–1.20)
Chloride: 107 mEq/L (ref 96–112)
GFR: 75.46 mL/min (ref >60.00)
Glucose, Bld: 95 mg/dL (ref 70–99)
Potassium: 4.8 mEq/L (ref 3.5–5.1)
Sodium: 143 mEq/L (ref 135–145)
TOTAL PROTEIN: 6.7 g/dL (ref 6.0–8.3)

## 2017-04-14 LAB — LIPID PANEL
CHOLESTEROL: 194 mg/dL (ref 0–200)
HDL: 57.7 mg/dL (ref >39.00)
LDL Cholesterol: 122 mg/dL — ABNORMAL HIGH (ref 0–99)
NonHDL: 136.41
TRIGLYCERIDES: 74 mg/dL (ref 0.0–149.0)
Total CHOL/HDL Ratio: 3
VLDL: 14.8 mg/dL (ref 0.0–40.0)

## 2017-04-14 LAB — CBC WITH DIFFERENTIAL/PLATELET
BASOS PCT: 0.5 % (ref 0.0–3.0)
Basophils Absolute: 0 10*3/uL (ref 0.0–0.1)
EOS ABS: 0.1 10*3/uL (ref 0.0–0.7)
Eosinophils Relative: 1.4 % (ref 0.0–5.0)
HEMATOCRIT: 38.5 % (ref 36.0–46.0)
HEMOGLOBIN: 13.3 g/dL (ref 12.0–15.0)
LYMPHS PCT: 33.4 % (ref 12.0–46.0)
Lymphs Abs: 1.5 10*3/uL (ref 0.7–4.0)
MCHC: 34.4 g/dL (ref 30.0–36.0)
MCV: 93 fl (ref 78.0–100.0)
MONOS PCT: 6.2 % (ref 3.0–12.0)
Monocytes Absolute: 0.3 10*3/uL (ref 0.1–1.0)
NEUTROS ABS: 2.6 10*3/uL (ref 1.4–7.7)
Neutrophils Relative %: 58.5 % (ref 43.0–77.0)
PLATELETS: 206 10*3/uL (ref 150.0–400.0)
RBC: 4.14 Mil/uL (ref 3.87–5.11)
RDW: 12.2 % (ref 11.5–15.5)
WBC: 4.4 10*3/uL (ref 4.0–10.5)

## 2017-04-14 LAB — TSH: TSH: 1.87 u[IU]/mL (ref 0.35–4.50)

## 2017-04-14 LAB — VITAMIN D 25 HYDROXY (VIT D DEFICIENCY, FRACTURES): VITD: 28.25 ng/mL — ABNORMAL LOW (ref 30.00–100.00)

## 2017-04-14 LAB — LDL CHOLESTEROL, DIRECT: Direct LDL: 125 mg/dL

## 2017-04-16 ENCOUNTER — Encounter: Payer: Self-pay | Admitting: Internal Medicine

## 2017-04-17 ENCOUNTER — Encounter: Payer: Self-pay | Admitting: Internal Medicine

## 2017-04-17 ENCOUNTER — Ambulatory Visit (INDEPENDENT_AMBULATORY_CARE_PROVIDER_SITE_OTHER): Payer: Managed Care, Other (non HMO) | Admitting: Internal Medicine

## 2017-04-17 VITALS — BP 110/72 | HR 107 | Temp 98.2°F | Resp 15 | Ht 66.0 in | Wt 167.8 lb

## 2017-04-17 DIAGNOSIS — G9332 Myalgic encephalomyelitis/chronic fatigue syndrome: Secondary | ICD-10-CM

## 2017-04-17 DIAGNOSIS — J3489 Other specified disorders of nose and nasal sinuses: Secondary | ICD-10-CM

## 2017-04-17 DIAGNOSIS — E2839 Other primary ovarian failure: Secondary | ICD-10-CM

## 2017-04-17 DIAGNOSIS — S46212A Strain of muscle, fascia and tendon of other parts of biceps, left arm, initial encounter: Secondary | ICD-10-CM

## 2017-04-17 DIAGNOSIS — M1810 Unilateral primary osteoarthritis of first carpometacarpal joint, unspecified hand: Secondary | ICD-10-CM | POA: Diagnosis not present

## 2017-04-17 DIAGNOSIS — R5382 Chronic fatigue, unspecified: Secondary | ICD-10-CM | POA: Diagnosis not present

## 2017-04-17 DIAGNOSIS — F411 Generalized anxiety disorder: Secondary | ICD-10-CM | POA: Diagnosis not present

## 2017-04-17 DIAGNOSIS — Z0000 Encounter for general adult medical examination without abnormal findings: Secondary | ICD-10-CM

## 2017-04-17 MED ORDER — ZOSTER VAC RECOMB ADJUVANTED 50 MCG/0.5ML IM SUSR: 0.5000 mL | Freq: Once | INTRAMUSCULAR | 1 refills | Status: AC

## 2017-04-17 NOTE — Patient Instructions (Addendum)
Your exam is normal.  For your post nasal drip  You should try [employer]'s Sinus rinse ;  It is a strong sinus "flush" using water and medicated salts.  Do it over the sink because it can be a bit messy.  Try taking benadryl at night for the PND  Every 8 hours,    I will order a DEXA scan to evaluate your bone density   You goal is 12000 mg calcium daily at least half through diet and supplements , and 1000 IUs fof Vitamin D3 daily  You can take up to 800 mg motrin every 8 hours for short periods of time (3-5 days )  Remember you can add tylenol to it , up to 2000 mg daily in divided doses (4000 mg for  severe and acute pain,  Do not  Take this dose for more than 4 or 5 days ) . Do NOT combine aleve with advil, or   Naproxen.   If you develop any stomach pain while taking it,  Stop it immediately.  If you are going to take it daily,  I recommend protecting your stomach with omeprazole or nexium     The ShingRx vaccine is now available in local pharmacies and is much more protective thant Zostavaxs,  It is therefore ADVISED for all interested adults over 50 to prevent shingles

## 2017-04-17 NOTE — Progress Notes (Signed)
Patient ID: Brandi Douglas, female    DOB: 1951-05-25  Age: 66 y.o. MRN: [id number]  The patient is here for annual non gyn preventive  examination and management of other chronic and acute problems.    Flu vaccine deferred,  Getting it for free next week Well woman exam was done by GYN in  August 2018: PAP smear normal Sept 2018, done every 3 years by [facility] GYN Mammogram normal [facility] Sept 2017, ordered Colonoscopy normal 2011, has IBS   history of [place] 2015   The risk factors are reflected in the social history.  The roster of all physicians providing medical care to patient - is listed in the Snapshot section of the chart.  Activities of daily living:  The patient is 100% independent in all ADLs: dressing, toileting, feeding as well as independent mobility  Home safety : The patient has smoke detectors in the home. They wear seatbelts.  There are no firearms at home. There is no violence in the home.   There is no risks for hepatitis, STDs or HIV. There is no   history of blood transfusion. They have no travel history to infectious disease endemic areas of the world.  The patient has seen their dentist in the last six month. They have seen their eye doctor in the last year.    They do not  have excessive sun exposure. Discussed the need for sun protection: hats, long sleeves and use of sunscreen if there is significant sun exposure.   Diet: the importance of a healthy diet is discussed. They do have a healthy diet.  The benefits of regular aerobic exercise were discussed. She does not exercise regularly .  Does yard work occasionally es.   Depression screen: there are no signs or vegative symptoms of untreated depression- irritability, change in appetite, anhedonia, sadness/tearfullness. Sees psychiatrist regularly for chronic fatigue (see below)   Cognitive assessment: the patient manages all their financial and personal affairs and is actively engaged. They could relate  day,date,year and events; recalled 2/3 objects at 3 minutes; performed clock-face test normally.  The following portions of the patient's history were reviewed and updated as appropriate: allergies, current medications, past family history, past medical history,  past surgical history, past social history  and problem list.  Visual acuity was not assessed per patient preference since she has regular follow up with her ophthalmologist. Hearing and body mass index were assessed and reviewed.   During the course of the visit the patient was educated and counseled about appropriate screening and preventive services including : fall prevention , diabetes screening, nutrition counseling, colorectal cancer screening, and recommended immunizations.    CC: The primary encounter diagnosis was Estrogen deficiency. Diagnoses of Visit for preventive health examination, Generalized anxiety disorder, Chronic fatigue syndrome, Biceps muscle strain, left, initial encounter, Primary osteoarthritis of first carpometacarpal joint, unspecified laterality, and Sinus drainage were also pertinent to this visit.  Fasting labs reviewed,  LDL up from 95 to 122 ,  HDL 58  10 yr risk of CAD is < 5% using FRC  Vit d slightly low at 28  Cc: Right arm pain medial side at shoulder. Started after lifting some heavy flower pots after the sotorm    Left thumb pain at the  Base,  Joint Is  Enlarged with OA noted .  Has had a painful steroid injection in thumb joint remotely.   Patient concerned about news received from sister about a strong FH of congestive  heart  failure. Multiple family members affected,  unclear etiology.  No FH of sudden death or early CAD. Affected members share common trait of obesity.   Diet reviewed  :  Cereal for breakfast.  Almond milk.    Lunch: sandwhich with lunch meat.  More chips    Dinner : chicken,  broiled fish,  Veggies,   avoids red meat, does not eat junk food on a regular basis.  No regular  exercise .  Yard work on weekends.   Seeing Nicolasa Ducking for chronic fatigue.  Trouble waking up ,  Using vyvanse needs 15 mg dose because the 20 mg dose felt too strong,  .  cymbalta dose raised to 60 mg daily as well. Trouble falling asleep .  Nicolasa Ducking stopped Paxil and started cymbalta.  Drinks 2 ounces  Of Certo  (suregel , a high fiber) mxied with fruit juice to manage constipation     URI 3 weeks ago,   Felt bad for 2 weeks,  Still having some sinus drainage .  No fevers,  No cough,   Does some yard work ,  Not going to  the gym more than once a week     History Brandi Douglas has a past medical history of Chronic fatigue syndrome; Generalized anxiety disorder; and Irritable bowel syndrome.   She has a past surgical history that includes Cholecystectomy.   Her family history is not on file.She reports that she has never smoked. She has never used smokeless tobacco. She reports that she does not drink alcohol or use drugs.  Outpatient Medications Prior to Visit  Medication Sig Dispense Refill  . ALPRAZolam (XANAX) 0.5 MG tablet Take 0.5-1 tablets (0.25-0.5 mg total) by mouth 2 (two) times daily as needed. For anxiety 30 tablet 5  . hyoscyamine (LEVSIN SL) 0.125 MG SL tablet Place 1 tablet (0.125 mg total) under the tongue every 4 (four) hours as needed for cramping. 30 tablet 3  . Lisdexamfetamine Dimesylate (VYVANSE) 10 MG CAPS Take 1 capsule by mouth daily.    . ondansetron (ZOFRAN) 8 MG tablet Take 1 tablet (8 mg total) by mouth every 8 (eight) hours as needed. For nausea 20 tablet 1  . SUMAtriptan (IMITREX) 100 MG tablet TAKE 1 TABLET BY MOUTH AT ONSET OF HEADACHE. MAY TAKE AN ADDITIONAL TABLET IN 2 HOURS IF NEEDED. MAX 2/24hr. 10 tablet 3  . tolterodine (DETROL LA) 4 MG 24 hr capsule Take 4 mg by mouth daily.    Marland Kitchen amoxicillin (AMOXIL) 875 MG tablet Take 1 tablet (875 mg total) by mouth 2 (two) times daily. (Patient not taking: Reported on 04/19/2016) 20 tablet 0  . Lactulose 20 GM/30ML SOLN 30 ml  every 4 hours until constipation is relieved (Patient not taking: Reported on 04/17/2017) 236 mL 3  . methylPREDNISolone (MEDROL DOSEPAK) 4 MG TBPK tablet Take 6 pills on day one then decrease by 1 pill each day 21 tablet 0  . PARoxetine (PAXIL) 20 MG tablet TAKE one and one-half TABLET EACH DAY     No facility-administered medications prior to visit.     Review of Systems   Patient denies headache, fevers, malaise, unintentional weight loss, skin rash, eye pain, sinus congestion and sinus pain, sore throat, dysphagia,  hemoptysis , cough, dyspnea, wheezing, chest pain, palpitations, orthopnea, edema, abdominal pain, nausea, melena, diarrhea, constipation, flank pain, dysuria, hematuria, urinary  Frequency, nocturia, numbness, tingling, seizures,  Focal weakness, Loss of consciousness,  Tremor, insomnia, depression, anxiety, and suicidal ideation.  Objective:  BP 110/72 (BP Location: Left Arm, Patient Position: Sitting, Cuff Size: Normal)   Pulse (!) 107   Temp 98.2 F (36.8 C) (Oral)   Resp 15   Ht 5\' 6"  (1.676 m)   Wt 167 lb 12.8 oz (76.1 kg)   SpO2 98%   BMI 27.08 kg/m   Physical Exam   General appearance: alert, cooperative and appears stated age Ears: normal TM's and external ear canals both ears Throat: lips, mucosa, and tongue normal; teeth and gums normal Neck: no adenopathy, no carotid bruit, supple, symmetrical, trachea midline and thyroid not enlarged, symmetric, no tenderness/mass/nodules Back: symmetric, no curvature. ROM normal. No CVA tenderness. Lungs: clear to auscultation bilaterally Heart: regular rate and rhythm, S1, S2 normal, no murmur, click, rub or gallop Abdomen: soft, non-tender; bowel sounds normal; no masses,  no organomegaly Pulses: 2+ and symmetric Skin: Skin color, texture, turgor normal. No rashes or lesions Lymph nodes: Cervical, supraclavicular, and axillary nodes normal.    Assessment & Plan:   Problem List Items Addressed This Visit     Biceps muscle strain, left, initial encounter    Secondary to deconditioning and incident of heavy lifting .  NSAID , ice and rest of muscle for a few days       Chronic fatigue syndrome    Managed by Dr Nicolasa Ducking with Vyvanse,  Effective dose appears to be 15 mg daily which is not available.  Screening labs normal.  No history of snoring.  Recommended participating in regular exercise program with goal of achieving a minimum of 30 minutes of aerobic activity 5 days per week.   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  Wants to avoid additional  steroid injection       Generalized anxiety disorder    Managed by Dr Nicolasa Ducking.  Paxil has been stopped and patient now taking cymbalta.  Some insomnia noted by patient.       Relevant Medications   DULoxetine (CYMBALTA) 60 MG capsule   Sinus drainage    Recommend saline irrigation and trial of benadryl  HEENT exam normal       Visit for preventive health examination    Annual comprehensive  exam was done as well as an evaluation and management of acute and  chronic conditions .  During the course of the visit the patient's schedule of appropriate screening and preventive services  Was reviewed, including :  diabetes screening, lipid analysis with projected  10 year  risk for CAD  Which is 4.7 % using the Framingham risk calculator for women, , nutrition counseling, breast, cervical and colorectal cancer screening, and recommended immunizations.  Printed recommendations for health maintenance screenings  was given.  DEXA scan ordered.   Lab Results  Component Value Date   CHOL 194 04/14/2017   HDL 57.70 04/14/2017   LDLCALC 122 (H) 04/14/2017   LDLDIRECT 125.0 04/14/2017   TRIG 74.0 04/14/2017  CHOLHDL 3 04/14/2017          Other Visit Diagnoses    Estrogen deficiency    -  Primary   Relevant Orders   DG Bone Density    A total of 40 minutes was spent with patient more than half of which was spent in counseling patient on the above mentioned issues , reviewing and explaining recent labs and imaging studies done, and coordination of care.   I have discontinued Brandi Douglas's PARoxetine, Lactulose, amoxicillin, and methylPREDNISolone. I am also having her start on Zoster Vaccine Adjuvanted. Additionally, I am having her maintain her tolterodine, ondansetron, ALPRAZolam, lisdexamfetamine, hyoscyamine, SUMAtriptan, and DULoxetine.  Meds ordered this encounter  Medications  . DULoxetine (CYMBALTA) 60 MG capsule    Sig: Take by mouth.  . Zoster Vaccine Adjuvanted [facility]) injection    Sig: Inject 0.5 mLs into the muscle once.    Dispense:  1 each    Refill:  1    Medications Discontinued During This Encounter  Medication Reason  . amoxicillin (AMOXIL) 875 MG tablet Completed Course  . Lactulose 20 GM/30ML SOLN Patient has not taken in last 30 days  . methylPREDNISolone (MEDROL DOSEPAK) 4 MG TBPK tablet Patient has not taken in last 30 days  . PARoxetine (PAXIL) 20 MG tablet Patient has not taken in last 30 days    Follow-up: No Follow-up on file.   Crecencio Mc, MD

## 2017-04-18 DIAGNOSIS — S46212A Strain of muscle, fascia and tendon of other parts of biceps, left arm, initial encounter: Secondary | ICD-10-CM | POA: Insufficient documentation

## 2017-04-18 DIAGNOSIS — J3489 Other specified disorders of nose and nasal sinuses: Secondary | ICD-10-CM | POA: Insufficient documentation

## 2017-04-18 DIAGNOSIS — M189 Osteoarthritis of first carpometacarpal joint, unspecified: Secondary | ICD-10-CM | POA: Insufficient documentation

## 2017-04-18 NOTE — Assessment & Plan Note (Addendum)
Annual comprehensive  exam was done as well as an evaluation and management of acute and  chronic conditions .  During the course of the visit the patient's schedule of appropriate screening and preventive services  Was reviewed, including :  diabetes screening, lipid analysis with projected  10 year  risk for CAD  Which is 4.7 % using the Framingham risk calculator for women, , nutrition counseling, breast, cervical and colorectal cancer screening, and recommended immunizations.  Printed recommendations for health maintenance screenings was given.  DEXA scan ordered.   Lab Results  Component Value Date   CHOL 194 04/14/2017   HDL 57.70 04/14/2017   LDLCALC 122 (H) 04/14/2017   LDLDIRECT 125.0 04/14/2017   TRIG 74.0 04/14/2017   CHOLHDL 3 04/14/2017

## 2017-04-18 NOTE — Assessment & Plan Note (Signed)
Secondary to deconditioning and incident of heavy lifting .  NSAID , ice and rest of muscle for a few days

## 2017-04-18 NOTE — Assessment & Plan Note (Signed)
Managed by Dr Nicolasa Ducking.  Paxil has been stopped and patient now taking cymbalta.  Some insomnia noted by patient.

## 2017-04-18 NOTE — Assessment & Plan Note (Signed)
Recommend saline irrigation and trial of benadryl  HEENT exam normal

## 2017-04-18 NOTE — Assessment & Plan Note (Addendum)
Managed by Dr Nicolasa Ducking with Vyvanse,  Effective dose appears to be 15 mg daily which is not available.  Screening labs normal.  No history of snoring.  Recommended participating in regular exercise program with goal of achieving a minimum of 30 minutes of aerobic activity 5 days per week.   Lab Results  Component Value Date   TSH 1.87 04/14/2017   Lab Results  Component Value Date   WBC 4.4 04/14/2017   HGB 13.3 04/14/2017   HCT 38.5 04/14/2017   MCV 93.0 04/14/2017   PLT 206.0 04/14/2017   Lab Results  Component Value Date   ALT 16 04/14/2017   AST 16 04/14/2017   ALKPHOS 85 04/14/2017   BILITOT 0.5 04/14/2017   Lab Results  Component Value Date   CREATININE 0.81 04/14/2017

## 2017-04-18 NOTE — Assessment & Plan Note (Signed)
Trial of NSAID.  Wants to avoid additional  steroid injection

## 2017-06-08 DIAGNOSIS — R5382 Chronic fatigue, unspecified: Secondary | ICD-10-CM | POA: Diagnosis not present

## 2017-06-08 DIAGNOSIS — R69 Illness, unspecified: Secondary | ICD-10-CM | POA: Diagnosis not present

## 2017-06-08 DIAGNOSIS — F330 Major depressive disorder, recurrent, mild: Secondary | ICD-10-CM | POA: Diagnosis not present

## 2017-06-22 ENCOUNTER — Encounter: Payer: Self-pay | Admitting: Internal Medicine

## 2017-06-22 MED ORDER — ONDANSETRON HCL 8 MG PO TABS
8.0000 mg | ORAL_TABLET | Freq: Three times a day (TID) | ORAL | 5 refills | Status: DC | PRN
Start: 2017-06-22 — End: 2020-01-21

## 2017-06-22 NOTE — Telephone Encounter (Signed)
Zofran   Refilled: 04/09/2012   Last OV: 04/17/2017   Next OV: 04/18/2018

## 2017-09-07 DIAGNOSIS — R5382 Chronic fatigue, unspecified: Secondary | ICD-10-CM | POA: Diagnosis not present

## 2017-09-07 DIAGNOSIS — R69 Illness, unspecified: Secondary | ICD-10-CM | POA: Diagnosis not present

## 2017-09-07 DIAGNOSIS — F330 Major depressive disorder, recurrent, mild: Secondary | ICD-10-CM | POA: Diagnosis not present

## 2017-12-13 DIAGNOSIS — R5382 Chronic fatigue, unspecified: Secondary | ICD-10-CM | POA: Diagnosis not present

## 2017-12-13 DIAGNOSIS — F330 Major depressive disorder, recurrent, mild: Secondary | ICD-10-CM | POA: Diagnosis not present

## 2017-12-13 DIAGNOSIS — R69 Illness, unspecified: Secondary | ICD-10-CM | POA: Diagnosis not present

## 2018-01-18 DIAGNOSIS — D2262 Melanocytic nevi of left upper limb, including shoulder: Secondary | ICD-10-CM | POA: Diagnosis not present

## 2018-01-18 DIAGNOSIS — D2272 Melanocytic nevi of left lower limb, including hip: Secondary | ICD-10-CM | POA: Diagnosis not present

## 2018-01-18 DIAGNOSIS — D2271 Melanocytic nevi of right lower limb, including hip: Secondary | ICD-10-CM | POA: Diagnosis not present

## 2018-01-18 DIAGNOSIS — Z85828 Personal history of other malignant neoplasm of skin: Secondary | ICD-10-CM | POA: Diagnosis not present

## 2018-01-18 DIAGNOSIS — D225 Melanocytic nevi of trunk: Secondary | ICD-10-CM | POA: Diagnosis not present

## 2018-01-18 DIAGNOSIS — D2261 Melanocytic nevi of right upper limb, including shoulder: Secondary | ICD-10-CM | POA: Diagnosis not present

## 2018-01-18 DIAGNOSIS — Z08 Encounter for follow-up examination after completed treatment for malignant neoplasm: Secondary | ICD-10-CM | POA: Diagnosis not present

## 2018-03-27 DIAGNOSIS — R69 Illness, unspecified: Secondary | ICD-10-CM | POA: Diagnosis not present

## 2018-03-27 DIAGNOSIS — F330 Major depressive disorder, recurrent, mild: Secondary | ICD-10-CM | POA: Diagnosis not present

## 2018-03-27 DIAGNOSIS — R5382 Chronic fatigue, unspecified: Secondary | ICD-10-CM | POA: Diagnosis not present

## 2018-04-18 ENCOUNTER — Ambulatory Visit (INDEPENDENT_AMBULATORY_CARE_PROVIDER_SITE_OTHER): Payer: Medicare HMO | Admitting: Internal Medicine

## 2018-04-18 ENCOUNTER — Encounter: Payer: Self-pay | Admitting: Internal Medicine

## 2018-04-18 VITALS — BP 118/82 | HR 98 | Temp 98.2°F | Resp 15 | Ht 66.0 in | Wt 168.2 lb

## 2018-04-18 DIAGNOSIS — Z780 Asymptomatic menopausal state: Secondary | ICD-10-CM | POA: Diagnosis not present

## 2018-04-18 DIAGNOSIS — R5383 Other fatigue: Secondary | ICD-10-CM | POA: Diagnosis not present

## 2018-04-18 DIAGNOSIS — Z0000 Encounter for general adult medical examination without abnormal findings: Secondary | ICD-10-CM

## 2018-04-18 DIAGNOSIS — E785 Hyperlipidemia, unspecified: Secondary | ICD-10-CM | POA: Diagnosis not present

## 2018-04-18 MED ORDER — ALPRAZOLAM 0.5 MG PO TABS: 0.2500 mg | ORAL_TABLET | Freq: Two times a day (BID) | ORAL | 5 refills | Status: AC | PRN

## 2018-04-18 NOTE — Patient Instructions (Addendum)
Your vitamin D  Has been borderline low in the past .  If you are not taking a D3 supplement,  please start taking 1000 [place] daily.  If you are ,  Please increase to 2000 IUs daily for the winter months. . Low Vitamin D can increase your risk of weak bones and fractures and interfere with your body's ability to absorb the calcium in your diet  You do not need any more PAP smears!  I do recommend that you have a bone density test,  And I will order that (medicare does pay for it)  I have refilled the alprazolam for :prn" use   Health Maintenance for Postmenopausal Women Menopause is a normal process in which your reproductive ability comes to an end. This process happens gradually over a span of months to years, usually between the ages of 50 and 11. Menopause is complete when you have missed 12 consecutive menstrual periods. It is important to talk with your health care provider about some of the most common conditions that affect postmenopausal women, such as heart disease, cancer, and bone loss (osteoporosis). Adopting a healthy lifestyle and getting preventive care can help to promote your health and wellness. Those actions can also lower your chances of developing some of these common conditions. What should I know about menopause? During menopause, you may experience a number of symptoms, such as:  Moderate-to-severe hot flashes.  Night sweats.  Decrease in sex drive.  Mood swings.  Headaches.  Tiredness.  Irritability.  Memory problems.  Insomnia.  Choosing to treat or not to treat menopausal changes is an individual decision that you make with your health care provider. What should I know about hormone replacement therapy and supplements? Hormone therapy products are effective for treating symptoms that are associated with menopause, such as hot flashes and night sweats. Hormone replacement carries certain risks, especially as you become older. If you are thinking about using  estrogen or estrogen with progestin treatments, discuss the benefits and risks with your health care provider. What should I know about heart disease and stroke? Heart disease, heart attack, and stroke become more likely as you age. This may be due, in part, to the hormonal changes that your body experiences during menopause. These can affect how your body processes dietary fats, triglycerides, and cholesterol. Heart attack and stroke are both medical emergencies. There are many things that you can do to help prevent heart disease and stroke:  Have your blood pressure checked at least every 1-2 years. High blood pressure causes heart disease and increases the risk of stroke.  If you are 78-30 years old, ask your health care provider if you should take aspirin to prevent a heart attack or a stroke.  Do not use any tobacco products, including cigarettes, chewing tobacco, or electronic cigarettes. If you need help quitting, ask your health care provider.  It is important to eat a healthy diet and maintain a healthy weight. ? Be sure to include plenty of vegetables, fruits, low-fat dairy products, and lean protein. ? Avoid eating foods that are high in solid fats, added sugars, or salt (sodium).  Get regular exercise. This is one of the most important things that you can do for your health. ? Try to exercise for at least 150 minutes each week. The type of exercise that you do should increase your heart rate and make you sweat. This is known as moderate-intensity exercise. ? Try to do strengthening exercises at least twice each week.  Do these in addition to the moderate-intensity exercise.  Know your numbers.Ask your health care provider to check your cholesterol and your blood glucose. Continue to have your blood tested as directed by your health care provider.  What should I know about cancer screening? There are several types of cancer. Take the following steps to reduce your risk and to catch  any cancer development as early as possible. Breast Cancer  Practice breast self-awareness. ? This means understanding how your breasts normally appear and feel. ? It also means doing regular breast self-exams. Let your health care provider know about any changes, no matter how small.  If you are 26 or older, have a clinician do a breast exam (clinical breast exam or CBE) every year. Depending on your age, family history, and medical history, it may be recommended that you also have a yearly breast X-ray (mammogram).  If you have a family history of breast cancer, talk with your health care provider about genetic screening.  If you are at high risk for breast cancer, talk with your health care provider about having an MRI and a mammogram every year.  Breast cancer (BRCA) gene test is recommended for women who have family members with BRCA-related cancers. Results of the assessment will determine the need for genetic counseling and BRCA1 and for BRCA2 testing. BRCA-related cancers include these types: ? Breast. This occurs in males or females. ? Ovarian. ? Tubal. This may also be called fallopian tube cancer. ? Cancer of the abdominal or pelvic lining (peritoneal cancer). ? Prostate. ? Pancreatic.  Cervical, Uterine, and Ovarian Cancer Your health care provider may recommend that you be screened regularly for cancer of the pelvic organs. These include your ovaries, uterus, and vagina. This screening involves a pelvic exam, which includes checking for microscopic changes to the surface of your cervix (Pap test).  For women ages 21-65, health care providers may recommend a pelvic exam and a Pap test every three years. For women ages 39-65, they may recommend the Pap test and pelvic exam, combined with testing for human papilloma virus (HPV), every five years. Some types of HPV increase your risk of cervical cancer. Testing for HPV may also be done on women of any age who have unclear Pap test  results.  Other health care providers may not recommend any screening for nonpregnant women who are considered low risk for pelvic cancer and have no symptoms. Ask your health care provider if a screening pelvic exam is right for you.  If you have had past treatment for cervical cancer or a condition that could lead to cancer, you need Pap tests and screening for cancer for at least 20 years after your treatment. If Pap tests have been discontinued for you, your risk factors (such as having a new sexual partner) need to be reassessed to determine if you should start having screenings again. Some women have medical problems that increase the chance of getting cervical cancer. In these cases, your health care provider may recommend that you have screening and Pap tests more often.  If you have a family history of uterine cancer or ovarian cancer, talk with your health care provider about genetic screening.  If you have vaginal bleeding after reaching menopause, tell your health care provider.  There are currently no reliable tests available to screen for ovarian cancer.  Lung Cancer Lung cancer screening is recommended for adults 5-2 years old who are at high risk for lung cancer because of a history  of smoking. A yearly low-dose CT scan of the lungs is recommended if you:  Currently smoke.  Have a history of at least 30 pack-years of smoking and you currently smoke or have quit within the past 15 years. A pack-year is smoking an average of one pack of cigarettes per day for one year.  Yearly screening should:  Continue until it has been 15 years since you quit.  Stop if you develop a health problem that would prevent you from having lung cancer treatment.  Colorectal Cancer  This type of cancer can be detected and can often be prevented.  Routine colorectal cancer screening usually begins at age 58 and continues through age 45.  If you have risk factors for colon cancer, your health  care provider may recommend that you be screened at an earlier age.  If you have a family history of colorectal cancer, talk with your health care provider about genetic screening.  Your health care provider may also recommend using home test kits to check for hidden blood in your stool.  A small camera at the end of a tube can be used to examine your colon directly (sigmoidoscopy or colonoscopy). This is done to check for the earliest forms of colorectal cancer.  Direct examination of the colon should be repeated every 5-10 years until age 15. However, if early forms of precancerous polyps or small growths are found or if you have a family history or genetic risk for colorectal cancer, you may need to be screened more often.  Skin Cancer  Check your skin from head to toe regularly.  Monitor any moles. Be sure to tell your health care provider: ? About any new moles or changes in moles, especially if there is a change in a mole's shape or color. ? If you have a mole that is larger than the size of a pencil eraser.  If any of your family members has a history of skin cancer, especially at a young age, talk with your health care provider about genetic screening.  Always use sunscreen. Apply sunscreen liberally and repeatedly throughout the day.  Whenever you are outside, protect yourself by wearing long sleeves, pants, a wide-brimmed hat, and sunglasses.  What should I know about osteoporosis? Osteoporosis is a condition in which bone destruction happens more quickly than new bone creation. After menopause, you may be at an increased risk for osteoporosis. To help prevent osteoporosis or the bone fractures that can happen because of osteoporosis, the following is recommended:  If you are 55-43 years old, get at least 1,000 mg of calcium and at least 600 mg of vitamin D per day.  If you are older than age 68 but younger than age 58, get at least 1,200 mg of calcium and at least 600 mg of  vitamin D per day.  If you are older than age 8, get at least 1,200 mg of calcium and at least 800 mg of vitamin D per day.  Smoking and excessive alcohol intake increase the risk of osteoporosis. Eat foods that are rich in calcium and vitamin D, and do weight-bearing exercises several times each week as directed by your health care provider. What should I know about how menopause affects my mental health? Depression may occur at any age, but it is more common as you become older. Common symptoms of depression include:  Low or sad mood.  Changes in sleep patterns.  Changes in appetite or eating patterns.  Feeling an overall lack  of motivation or enjoyment of activities that you previously enjoyed.  Frequent crying spells.  Talk with your health care provider if you think that you are experiencing depression. What should I know about immunizations? It is important that you get and maintain your immunizations. These include:  Tetanus, diphtheria, and pertussis (Tdap) booster vaccine.  Influenza every year before the flu season begins.  Pneumonia vaccine.  Shingles vaccine.  Your health care provider may also recommend other immunizations. This information is not intended to replace advice given to you by your health care provider. Make sure you discuss any questions you have with your health care provider. Document Released: 08/12/2005 Document Revised: 01/08/2016 Document Reviewed: 03/24/2015 Elsevier Interactive Patient Education  2018 [employer].

## 2018-04-18 NOTE — Progress Notes (Signed)
Patient ID: Brandi Douglas, female    DOB: 23-Jan-1951  Age: 67 y.o. MRN: [id number]  The patient is here for annual Medicare wellness examination and management of other chronic and acute problems.  Colonoscopy  2011 PAP normal 2018 hpv negative  Mammogram normal [facility] Annual derm with [facility]  Sees donnelly    Declines  Shingles  Due to husband david's reaction to it:  Sweats,  Muscle aches. ,     The risk factors are reflected in the social history.  The roster of all physicians providing medical care to patient - is listed in the Snapshot section of the chart.  Activities of daily living:  The patient is 100% independent in all ADLs: dressing, toileting, feeding as well as independent mobility  Home safety : The patient has smoke detectors in the home. They wear seatbelts.  There are no firearms at home. There is no violence in the home.   There is no risks for hepatitis, STDs or HIV. There is no   history of blood transfusion. They have no travel history to infectious disease endemic areas of the world.  The patient has seen their dentist in the last six month. They have seen their eye doctor in the last year. They admit to slight hearing difficulty with regard to whispered voices and some television programs.  They have deferred audiologic testing in the last year.  They do not  have excessive sun exposure. Discussed the need for sun protection: hats, long sleeves and use of sunscreen if there is significant sun exposure.   Diet: the importance of a healthy diet is discussed. They do have a healthy diet.  The benefits of regular aerobic exercise were discussed. She walks 4 times per week ,  20 minutes.   Depression screen: there are no signs or vegative symptoms of depression- irritability, change in appetite, anhedonia, sadness/tearfullness.  Cognitive assessment: the patient manages all their financial and personal affairs and is actively engaged. They could relate  day,date,year and events; recalled 2/3 objects at 3 minutes; performed clock-face test normally.  The following portions of the patient's history were reviewed and updated as appropriate: allergies, current medications, past family history, past medical history,  past surgical history, past social history  and problem list.  Visual acuity was not assessed per patient preference since she has regular follow up with her ophthalmologist. Hearing and body mass index were assessed and reviewed.   During the course of the visit the patient was educated and counseled about appropriate screening and preventive services including : fall prevention , diabetes screening, nutrition counseling, colorectal cancer screening, and recommended immunizations.    CC: There were no encounter diagnoses.  IBS: now constipation predominant for the last yeasrs. Has occasional presyncope with straining.  Better with increased fruit intake .hd some bloating,  Reduce detrol to every other day for bladder issues, and both are under control  GERD:  Stopped taking nexium.  Using Pepcid AC prn.       History Brandi Douglas has a past medical history of Chronic fatigue syndrome, Generalized anxiety disorder, and Irritable bowel syndrome.   She has a past surgical history that includes Cholecystectomy.   Her family history is not on file.She reports that she has never smoked. She has never used smokeless tobacco. She reports that she does not drink alcohol or use drugs.  Outpatient Medications Prior to Visit  Medication Sig Dispense Refill  . ALPRAZolam (XANAX) 0.5 MG tablet Take 0.5-1 tablets (0.25-0.5  mg total) by mouth 2 (two) times daily as needed. For anxiety 30 tablet 5  . DULoxetine (CYMBALTA) 60 MG capsule Take by mouth.    . Lisdexamfetamine Dimesylate (VYVANSE) 10 MG CAPS Take 1 capsule by mouth daily.    . ondansetron (ZOFRAN) 8 MG tablet Take 1 tablet (8 mg total) by mouth every 8 (eight) hours as needed. For nausea 20  tablet 5  . SUMAtriptan (IMITREX) 100 MG tablet TAKE 1 TABLET BY MOUTH AT ONSET OF HEADACHE. MAY TAKE AN ADDITIONAL TABLET IN 2 HOURS IF NEEDED. MAX 2/24hr. 10 tablet 3  . tolterodine (DETROL LA) 4 MG 24 hr capsule Take 4 mg by mouth daily.    . hyoscyamine (LEVSIN SL) 0.125 MG SL tablet Place 1 tablet (0.125 mg total) under the tongue every 4 (four) hours as needed for cramping. (Patient not taking: Reported on 04/18/2018) 30 tablet 3   No facility-administered medications prior to visit.     Review of Systems  Objective:  BP 118/82 (BP Location: Left Arm, Patient Position: Sitting, Cuff Size: Normal)   Pulse 98   Temp 98.2 F (36.8 C) (Oral)   Resp 15   Ht 5\' 6"  (1.676 m)   Wt 168 lb 3.2 oz (76.3 kg)   SpO2 99%   BMI 27.15 kg/m   Physical Exam    Assessment & Plan:   Problem List Items Addressed This Visit    None      I have discontinued Maryann Alar hyoscyamine. I am also having her maintain her tolterodine, ALPRAZolam, lisdexamfetamine, SUMAtriptan, DULoxetine, and ondansetron.  No orders of the defined types were placed in this encounter.   Medications Discontinued During This Encounter  Medication Reason  . hyoscyamine (LEVSIN SL) 0.125 MG SL tablet Patient has not taken in last 30 days    Follow-up: No follow-ups on file.   Crecencio Mc, MD

## 2018-04-21 NOTE — Assessment & Plan Note (Signed)

## 2018-04-24 ENCOUNTER — Encounter: Payer: Self-pay | Admitting: Internal Medicine

## 2018-04-24 ENCOUNTER — Ambulatory Visit (INDEPENDENT_AMBULATORY_CARE_PROVIDER_SITE_OTHER): Payer: Medicare HMO | Admitting: Internal Medicine

## 2018-04-24 ENCOUNTER — Other Ambulatory Visit (INDEPENDENT_AMBULATORY_CARE_PROVIDER_SITE_OTHER): Payer: Medicare HMO

## 2018-04-24 VITALS — BP 120/78 | HR 107 | Temp 98.5°F | Ht 66.0 in | Wt 168.0 lb

## 2018-04-24 DIAGNOSIS — R5383 Other fatigue: Secondary | ICD-10-CM

## 2018-04-24 DIAGNOSIS — J029 Acute pharyngitis, unspecified: Secondary | ICD-10-CM | POA: Diagnosis not present

## 2018-04-24 DIAGNOSIS — E785 Hyperlipidemia, unspecified: Secondary | ICD-10-CM | POA: Diagnosis not present

## 2018-04-24 DIAGNOSIS — K122 Cellulitis and abscess of mouth: Secondary | ICD-10-CM | POA: Diagnosis not present

## 2018-04-24 LAB — CBC WITH DIFFERENTIAL/PLATELET
BASOS ABS: 0.1 10*3/uL (ref 0.0–0.1)
BASOS PCT: 1 % (ref 0.0–3.0)
EOS ABS: 0.1 10*3/uL (ref 0.0–0.7)
Eosinophils Relative: 0.9 % (ref 0.0–5.0)
HCT: 37.1 % (ref 36.0–46.0)
Hemoglobin: 13 g/dL (ref 12.0–15.0)
LYMPHS ABS: 1.1 10*3/uL (ref 0.7–4.0)
LYMPHS PCT: 15.2 % (ref 12.0–46.0)
MCHC: 35 g/dL (ref 30.0–36.0)
MCV: 92.2 fl (ref 78.0–100.0)
MONO ABS: 0.5 10*3/uL (ref 0.1–1.0)
Monocytes Relative: 6 % (ref 3.0–12.0)
NEUTROS ABS: 5.8 10*3/uL (ref 1.4–7.7)
NEUTROS PCT: 76.9 % (ref 43.0–77.0)
PLATELETS: 166 10*3/uL (ref 150.0–400.0)
RBC: 4.02 Mil/uL (ref 3.87–5.11)
RDW: 12.3 % (ref 11.5–15.5)
WBC: 7.6 10*3/uL (ref 4.0–10.5)

## 2018-04-24 LAB — TSH: TSH: 2.49 u[IU]/mL (ref 0.35–4.50)

## 2018-04-24 LAB — COMPREHENSIVE METABOLIC PANEL
ALT: 16 U/L (ref 0–35)
AST: 16 U/L (ref 0–37)
Albumin: 4.3 g/dL (ref 3.5–5.2)
Alkaline Phosphatase: 83 U/L (ref 39–117)
BILIRUBIN TOTAL: 0.8 mg/dL (ref 0.2–1.2)
BUN: 14 mg/dL (ref 6–23)
CO2: 28 meq/L (ref 19–32)
CREATININE: 0.8 mg/dL (ref 0.40–1.20)
Calcium: 9.5 mg/dL (ref 8.4–10.5)
Chloride: 105 mEq/L (ref 96–112)
GFR: 76.31 mL/min (ref >60.00)
Glucose, Bld: 98 mg/dL (ref 70–99)
Potassium: 4.4 mEq/L (ref 3.5–5.1)
SODIUM: 139 meq/L (ref 135–145)
Total Protein: 6.9 g/dL (ref 6.0–8.3)

## 2018-04-24 LAB — LIPID PANEL
CHOLESTEROL: 154 mg/dL (ref 0–200)
HDL: 58.3 mg/dL (ref >39.00)
LDL CALC: 84 mg/dL (ref 0–99)
NonHDL: 95.56
Total CHOL/HDL Ratio: 3
Triglycerides: 60 mg/dL (ref 0.0–149.0)
VLDL: 12 mg/dL (ref 0.0–40.0)

## 2018-04-24 LAB — POC INFLUENZA A&B (BINAX/QUICKVUE)
INFLUENZA A, POC: NEGATIVE
INFLUENZA B, POC: NEGATIVE

## 2018-04-24 LAB — POCT RAPID STREP A (OFFICE): Rapid Strep A Screen: NEGATIVE

## 2018-04-24 MED ORDER — AZITHROMYCIN 250 MG PO TABS: ORAL_TABLET | ORAL | 0 refills | Status: DC

## 2018-04-24 MED ORDER — FLUCONAZOLE 150 MG PO TABS: 150.0000 mg | ORAL_TABLET | Freq: Once | ORAL | 0 refills | Status: AC

## 2018-04-24 NOTE — Progress Notes (Signed)
Pre visit review using our clinic review tool, if applicable. No additional management support is needed unless otherwise documented below in the visit note. 

## 2018-04-24 NOTE — Progress Notes (Signed)
Chief Complaint  Patient presents with  . Sore Throat   C/o sore throat and uvula swelling noted since last night and worsening tried essential oil deep blue and coughed up mucous this am she had flu shot Thursday at at a work fair. Strep negative today no sick contacts, she c/o being flushed and sweating last night, unsure if she had fever but she has had neck pain. Denies cough    Review of Systems  Constitutional: Negative for weight loss.  HENT: Positive for sore throat.   Eyes: Negative for blurred vision.  Respiratory: Negative for shortness of breath.   Cardiovascular: Negative for chest pain.  Musculoskeletal: Positive for neck pain.  Neurological: Negative for headaches.  Psychiatric/Behavioral: Negative for depression.   Past Medical History:  Diagnosis Date  . Chronic fatigue syndrome   . Generalized anxiety disorder   . Irritable bowel syndrome    Past Surgical History:  Procedure Laterality Date  . CHOLECYSTECTOMY     No family history on file. Social History   Socioeconomic History  . Marital status: Married    Spouse name: Eddie Dibbles   . Number of children: Not on file  . Years of education: Not on file  . Highest education level: Not on file  Occupational History  . 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  Social Needs  . Financial resource strain: Not on file  . Food insecurity:    Worry: Not on file    Inability: Not on file  . Transportation needs:    Medical: Not on file    Non-medical: Not on file  Tobacco Use  . Smoking status: Never Smoker  . Smokeless tobacco: Never Used  Substance and Sexual Activity  . Alcohol use: No  . Drug use: No  . Sexual activity: Yes    Partners: Male  Lifestyle  . Physical activity:    Days per week: Not on file    Minutes per session: Not on file  . Stress: Not on file  Relationships  . Social connections:    Talks on phone: Not on file    Gets together: Not on file    Attends religious  service: Not on file    Active member of club or organization: Not on file    Attends meetings of clubs or organizations: Not on file    Relationship status: Not on file  . Intimate partner violence:    Fear of current or ex partner: Not on file    Emotionally abused: Not on file    Physically abused: Not on file    Forced sexual activity: Not on file  Other Topics Concern  . Not on file  Social History Narrative  . Not on file   Current Meds  Medication Sig  . ALPRAZolam (XANAX) 0.5 MG tablet Take 0.5-1 tablets (0.25-0.5 mg total) by mouth 2 (two) times daily as needed. For anxiety  . DULoxetine (CYMBALTA) 60 MG capsule Take by mouth.  . lisdexamfetamine (VYVANSE) 20 MG capsule Take 1 capsule by mouth daily.   . ondansetron (ZOFRAN) 8 MG tablet Take 1 tablet (8 mg total) by mouth every 8 (eight) hours as needed. For nausea  . SUMAtriptan (IMITREX) 100 MG tablet TAKE 1 TABLET BY MOUTH AT ONSET OF HEADACHE. MAY TAKE AN ADDITIONAL TABLET IN 2 HOURS IF NEEDED. MAX 2/24hr.  . tolterodine (DETROL LA) 4 MG 24 hr capsule Take 4 mg by mouth daily.   Allergies  Allergen  Reactions  . Aspirin Other (See Comments)    Burns stomach  . Dexilant [Dexlansoprazole]     nightmares  . Lubiprostone Nausea Only   No results found for this or any previous visit (from the past 2160 hour(s)). Objective  Body mass index is 27.12 kg/m. Wt Readings from Last 3 Encounters:  04/24/18 168 lb (76.2 kg)  04/18/18 168 lb 3.2 oz (76.3 kg)  04/17/17 167 lb 12.8 oz (76.1 kg)   Temp Readings from Last 3 Encounters:  04/24/18 98.5 F (36.9 C) (Oral)  04/18/18 98.2 F (36.8 C) (Oral)  04/17/17 98.2 F (36.8 C) (Oral)   BP Readings from Last 3 Encounters:  04/24/18 120/78  04/18/18 118/82  04/17/17 110/72   Pulse Readings from Last 3 Encounters:  04/24/18 (!) 107  04/18/18 98  04/17/17 (!) 107    Physical Exam  Constitutional: She is oriented to person, place, and time. Vital signs are normal.  She appears well-developed and well-nourished. She is cooperative.  HENT:  Head: Normocephalic and atraumatic.  Right Ear: Hearing normal.  Left Ear: Hearing normal.  Mouth/Throat: Uvula is midline, oropharynx is clear and moist and mucous membranes are normal. Uvula swelling present.  Eyes: Pupils are equal, round, and reactive to light. Conjunctivae are normal.  Cardiovascular: Regular rhythm. Tachycardia present.  Pulmonary/Chest: Effort normal and breath sounds normal.  Abdominal: Soft. Normal appearance and bowel sounds are normal.  Neurological: She is alert and oriented to person, place, and time. Gait normal.  Skin: Skin is warm, dry and intact.  Psychiatric: She has a normal mood and affect. Her speech is normal and behavior is normal. Judgment and thought content normal. Cognition and memory are normal.  Nursing note and vitals reviewed.   Assessment   1. Uvulitis and sore throat strep and flu negative  Plan  1. zpack and prn Diflucan  Supportive care given handout of otc to try  Provider: Dr. Olivia Mackie McLean-Scocuzza-[facility]

## 2018-04-24 NOTE — Patient Instructions (Addendum)
Uvulitis is infection or inflammation of the uvula. The uvula is the small, finger-like piece of tissue that hangs down at the back of your throat. What are the causes? This condition may be caused by:  An infection in the mouth or throat. This is the most common cause.  Trauma to the uvula. Causes of trauma include burning your mouth and heavy snoring.  Fluid build-up (edema). Edema can be triggered be an allergic reaction. Uvulitis that is caused by edema is called Quincke disease.  Inhaling irritants, such as chemical agents, smoke, or steam.  What are the signs or symptoms? Symptoms of this condition depend on the cause. Symptoms of uvulitis that is caused by infection include:  Red, swollen uvula.  Sore throat.  Fever.  Headache.  Swollen neck glands.  Symptoms of uvulitis that is caused by trauma, edema, or irritation include:  Red, swollen uvula.  Sore throat.  Trouble swallowing.  Choking or gagging.  Trouble breathing.  How is this diagnosed? This condition is diagnosed with a physical exam. You also may have tests, such as a throat culture and blood tests. How is this treated? Treatment for this condition depends on the cause. Treatment may involve:  Antibiotic medicine. Antibiotics may be prescribed if a bacterial infection is the cause.  Steroid medicine. Steroids may be given if edema is the cause.  Surgery to remove part of the uvula (partial uvulectomy).  Follow these instructions at home:  Rest as much as possible until your condition improves.  Drink enough fluid to keep your urine clear or pale yellow.  Take over-the-counter and prescription medicines only as told by your health care provider.  If you were prescribed an antibiotic medicine, take it as told by your health care provider. Do not stop taking the antibiotic even if you start to feel better.  Use a cool-mist humidifier to ease irritation in your throat.  While your throat  is sore: ? Eat soft foods or drink liquids, such as soup. ? Gargle with a salt-water mixture 3-4 times per day or as needed. To make a salt-water mixture, completely dissolve -1 tsp of salt in 1 cup of warm water.  Keep all follow-up visits as told by your health care provider. This is important. Contact a health care provider if:  You have a fever.  You have trouble eating.  Your symptoms do not get better.  Your symptoms come back after treatment. Get help right away if:  You have trouble breathing.  You have trouble swallowing. This information is not intended to replace advice given to you by your health care provider. Make sure you discuss any questions you have with your health care provider. Document Released: 01/29/2004 Document Revised: 02/21/2016 Document Reviewed: 09/10/2014 Elsevier Interactive Patient Education  2018 [employer].  Sore Throat A sore throat is pain, burning, irritation, or scratchiness in the throat. When you have a sore throat, you may feel pain or tenderness in your throat when you swallow or talk. Many things can cause a sore throat, including:  An infection.  Seasonal allergies.  Dryness in the air.  Irritants, such as smoke or pollution.  Gastroesophageal reflux disease (GERD).  A tumor.  A sore throat is often the first sign of another sickness. It may happen with other symptoms, such as coughing, sneezing, fever, and swollen neck glands. Most sore throats go away without medical treatment. Follow these instructions at home:  Take over-the-counter medicines only as told by your health care  provider.  Drink enough fluids to keep your urine clear or pale yellow.  Rest as needed.  To help with pain, try: ? Sipping warm liquids, such as broth, herbal tea, or warm water. ? Eating or drinking cold or frozen liquids, such as frozen ice pops. ? Gargling with a salt-water mixture 3-4 times a day or as needed. To make a salt-water  mixture, completely dissolve -1 tsp of salt in 1 cup of warm water. ? Sucking on hard candy or throat lozenges. ? Putting a cool-mist humidifier in your bedroom at night to moisten the air. ? Sitting in the bathroom with the door closed for 5-10 minutes while you run hot water in the shower.  Do not use any tobacco products, such as cigarettes, chewing tobacco, and e-cigarettes. If you need help quitting, ask your health care provider. Contact a health care provider if:  You have a fever for more than 2-3 days.  You have symptoms that last (are persistent) for more than 2-3 days.  Your throat does not get better within 7 days.  You have a fever and your symptoms suddenly get worse. Get help right away if:  You have difficulty breathing.  You cannot swallow fluids, soft foods, or your saliva.  You have increased swelling in your throat or neck.  You have persistent nausea and vomiting. This information is not intended to replace advice given to you by your health care provider. Make sure you discuss any questions you have with your health care provider. Document Released: 07/28/2004 Document Revised: 02/14/2016 Document Reviewed: 04/10/2015 Elsevier Interactive Patient Education  [employer].

## 2018-04-26 ENCOUNTER — Other Ambulatory Visit: Payer: Self-pay

## 2018-04-26 ENCOUNTER — Encounter: Payer: Self-pay | Admitting: Emergency Medicine

## 2018-04-26 ENCOUNTER — Emergency Department: Payer: Medicare HMO

## 2018-04-26 ENCOUNTER — Inpatient Hospital Stay
Admission: EM | Admit: 2018-04-26 | Discharge: 2018-04-28 | DRG: 872 | Disposition: A | Discharge status: Home / Self Care | Payer: Medicare HMO | Attending: Internal Medicine | Admitting: Internal Medicine

## 2018-04-26 ENCOUNTER — Ambulatory Visit: Payer: Medicare HMO | Admitting: Family Medicine

## 2018-04-26 ENCOUNTER — Encounter: Payer: Self-pay | Admitting: Family Medicine

## 2018-04-26 VITALS — BP 100/68 | HR 112 | Temp 98.6°F | Ht 66.0 in | Wt 163.6 lb

## 2018-04-26 DIAGNOSIS — Z9049 Acquired absence of other specified parts of digestive tract: Secondary | ICD-10-CM | POA: Diagnosis not present

## 2018-04-26 DIAGNOSIS — R5382 Chronic fatigue, unspecified: Secondary | ICD-10-CM | POA: Diagnosis present

## 2018-04-26 DIAGNOSIS — R1310 Dysphagia, unspecified: Secondary | ICD-10-CM | POA: Diagnosis present

## 2018-04-26 DIAGNOSIS — A419 Sepsis, unspecified organism: Principal | ICD-10-CM | POA: Diagnosis present

## 2018-04-26 DIAGNOSIS — Z886 Allergy status to analgesic agent status: Secondary | ICD-10-CM | POA: Diagnosis not present

## 2018-04-26 DIAGNOSIS — R32 Unspecified urinary incontinence: Secondary | ICD-10-CM | POA: Diagnosis not present

## 2018-04-26 DIAGNOSIS — Z8249 Family history of ischemic heart disease and other diseases of the circulatory system: Secondary | ICD-10-CM

## 2018-04-26 DIAGNOSIS — F411 Generalized anxiety disorder: Secondary | ICD-10-CM | POA: Diagnosis present

## 2018-04-26 DIAGNOSIS — J029 Acute pharyngitis, unspecified: Secondary | ICD-10-CM | POA: Diagnosis not present

## 2018-04-26 DIAGNOSIS — K219 Gastro-esophageal reflux disease without esophagitis: Secondary | ICD-10-CM | POA: Diagnosis present

## 2018-04-26 DIAGNOSIS — R590 Localized enlarged lymph nodes: Secondary | ICD-10-CM | POA: Diagnosis not present

## 2018-04-26 DIAGNOSIS — Z79899 Other long term (current) drug therapy: Secondary | ICD-10-CM

## 2018-04-26 DIAGNOSIS — Z87898 Personal history of other specified conditions: Secondary | ICD-10-CM | POA: Diagnosis not present

## 2018-04-26 DIAGNOSIS — K122 Cellulitis and abscess of mouth: Secondary | ICD-10-CM | POA: Diagnosis not present

## 2018-04-26 DIAGNOSIS — R5383 Other fatigue: Secondary | ICD-10-CM

## 2018-04-26 DIAGNOSIS — K589 Irritable bowel syndrome without diarrhea: Secondary | ICD-10-CM | POA: Diagnosis not present

## 2018-04-26 DIAGNOSIS — R69 Illness, unspecified: Secondary | ICD-10-CM | POA: Diagnosis not present

## 2018-04-26 DIAGNOSIS — Z888 Allergy status to other drugs, medicaments and biological substances status: Secondary | ICD-10-CM | POA: Diagnosis not present

## 2018-04-26 LAB — URINALYSIS, COMPLETE (UACMP) WITH MICROSCOPIC
Bacteria, UA: NONE SEEN
Bilirubin Urine: NEGATIVE
GLUCOSE, UA: NEGATIVE mg/dL
Hgb urine dipstick: NEGATIVE
KETONES UR: 80 mg/dL — AB
Leukocytes, UA: NEGATIVE
Nitrite: NEGATIVE
PH: 5 (ref 5.0–8.0)
Protein, ur: NEGATIVE mg/dL
Specific Gravity, Urine: >1.046 — ABNORMAL HIGH (ref 1.005–1.030)

## 2018-04-26 LAB — CBC WITH DIFFERENTIAL/PLATELET
Abs Immature Granulocytes: 0.04 10*3/uL (ref 0.00–0.07)
BASOS ABS: 0 10*3/uL (ref 0.0–0.1)
Basophils Relative: 0 %
EOS ABS: 0 10*3/uL (ref 0.0–0.5)
Eosinophils Relative: 0 %
HEMATOCRIT: 38.7 % (ref 36.0–46.0)
HEMOGLOBIN: 13.4 g/dL (ref 12.0–15.0)
IMMATURE GRANULOCYTES: 0 %
LYMPHS ABS: 0.2 10*3/uL — AB (ref 0.7–4.0)
LYMPHS PCT: 2 %
MCH: 31.7 pg (ref 26.0–34.0)
MCHC: 34.6 g/dL (ref 30.0–36.0)
MCV: 91.5 fL (ref 80.0–100.0)
MONOS PCT: 5 %
Monocytes Absolute: 0.5 10*3/uL (ref 0.1–1.0)
NEUTROS PCT: 93 %
NRBC: 0 % (ref 0.0–0.2)
Neutro Abs: 10 10*3/uL — ABNORMAL HIGH (ref 1.7–7.7)
Platelets: 165 10*3/uL (ref 150–400)
RBC: 4.23 MIL/uL (ref 3.87–5.11)
RDW: 12.1 % (ref 11.5–15.5)
WBC: 10.8 10*3/uL — ABNORMAL HIGH (ref 4.0–10.5)

## 2018-04-26 LAB — BASIC METABOLIC PANEL
ANION GAP: 12 (ref 5–15)
BUN: 17 mg/dL (ref 8–23)
CALCIUM: 9.5 mg/dL (ref 8.9–10.3)
CHLORIDE: 104 mmol/L (ref 98–111)
CO2: 24 mmol/L (ref 22–32)
Creatinine, Ser: 0.78 mg/dL (ref 0.44–1.00)
GFR calc non Af Amer: >60 mL/min (ref >60)
GLUCOSE: 139 mg/dL — AB (ref 70–99)
POTASSIUM: 3.8 mmol/L (ref 3.5–5.1)
Sodium: 140 mmol/L (ref 135–145)

## 2018-04-26 LAB — GROUP A STREP BY PCR: Group A Strep by PCR: NOT DETECTED

## 2018-04-26 LAB — LACTIC ACID, PLASMA: Lactic Acid, Venous: 1.4 mmol/L (ref 0.5–1.9)

## 2018-04-26 MED ORDER — ACETAMINOPHEN 325 MG PO TABS
650.0000 mg | ORAL_TABLET | Freq: Four times a day (QID) | ORAL | Status: DC | PRN
  Administered 2018-04-27: 650 mg via ORAL
  Filled 2018-04-26: qty 2

## 2018-04-26 MED ORDER — ACETAMINOPHEN 650 MG RE SUPP: 650.0000 mg | Freq: Four times a day (QID) | RECTAL | Status: DC | PRN

## 2018-04-26 MED ORDER — ALPRAZOLAM 0.25 MG PO TABS: 0.2500 mg | ORAL_TABLET | Freq: Two times a day (BID) | ORAL | Status: DC | PRN

## 2018-04-26 MED ORDER — LIDOCAINE VISCOUS HCL 2 % MT SOLN
15.0000 mL | Freq: Once | OROMUCOSAL | Status: AC
  Administered 2018-04-26: 15 mL via OROMUCOSAL
  Filled 2018-04-26: qty 15

## 2018-04-26 MED ORDER — DEXAMETHASONE SODIUM PHOSPHATE 10 MG/ML IJ SOLN
10.0000 mg | Freq: Once | INTRAMUSCULAR | Status: AC
  Administered 2018-04-26: 10 mg via INTRAVENOUS
  Filled 2018-04-26: qty 1

## 2018-04-26 MED ORDER — SODIUM CHLORIDE 0.9 % IV SOLN
3.0000 g | Freq: Once | INTRAVENOUS | Status: AC
  Administered 2018-04-26: 3 g via INTRAVENOUS
  Filled 2018-04-26: qty 3

## 2018-04-26 MED ORDER — FESOTERODINE FUMARATE ER 4 MG PO TB24
4.0000 mg | ORAL_TABLET | Freq: Every day | ORAL | Status: DC
  Administered 2018-04-27: 4 mg via ORAL
  Filled 2018-04-26 (×3): qty 1

## 2018-04-26 MED ORDER — DULOXETINE HCL 30 MG PO CPEP
60.0000 mg | ORAL_CAPSULE | Freq: Every day | ORAL | Status: DC
  Administered 2018-04-27: 60 mg via ORAL
  Filled 2018-04-26: qty 2

## 2018-04-26 MED ORDER — ENOXAPARIN SODIUM 40 MG/0.4ML ~~LOC~~ SOLN
40.0000 mg | SUBCUTANEOUS | Status: DC
  Filled 2018-04-26 (×2): qty 0.4

## 2018-04-26 MED ORDER — KETOROLAC TROMETHAMINE 30 MG/ML IJ SOLN
30.0000 mg | Freq: Once | INTRAMUSCULAR | Status: AC
  Administered 2018-04-26: 30 mg via INTRAVENOUS

## 2018-04-26 MED ORDER — DEXAMETHASONE SODIUM PHOSPHATE 4 MG/ML IJ SOLN
4.0000 mg | Freq: Four times a day (QID) | INTRAMUSCULAR | Status: DC
  Administered 2018-04-27 – 2018-04-28 (×6): 4 mg via INTRAVENOUS
  Filled 2018-04-26 (×6): qty 1

## 2018-04-26 MED ORDER — SODIUM CHLORIDE 0.9 % IV SOLN
3.0000 g | Freq: Four times a day (QID) | INTRAVENOUS | Status: DC
  Administered 2018-04-26 – 2018-04-28 (×6): 3 g via INTRAVENOUS
  Filled 2018-04-26 (×9): qty 3

## 2018-04-26 MED ORDER — SODIUM CHLORIDE 0.9 % IV BOLUS
1000.0000 mL | Freq: Once | INTRAVENOUS | Status: AC
  Administered 2018-04-26: 1000 mL via INTRAVENOUS

## 2018-04-26 MED ORDER — ONDANSETRON HCL 4 MG PO TABS: 4.0000 mg | ORAL_TABLET | Freq: Four times a day (QID) | ORAL | Status: DC | PRN

## 2018-04-26 MED ORDER — ONDANSETRON HCL 4 MG/2ML IJ SOLN: 4.0000 mg | Freq: Four times a day (QID) | INTRAMUSCULAR | Status: DC | PRN

## 2018-04-26 MED ORDER — IOHEXOL 300 MG/ML  SOLN
75.0000 mL | Freq: Once | INTRAMUSCULAR | Status: AC | PRN
  Administered 2018-04-26: 75 mL via INTRAVENOUS

## 2018-04-26 MED ORDER — LISDEXAMFETAMINE DIMESYLATE 20 MG PO CAPS
20.0000 mg | ORAL_CAPSULE | Freq: Every day | ORAL | Status: DC
  Administered 2018-04-27: 20 mg via ORAL
  Filled 2018-04-26: qty 1

## 2018-04-26 MED ORDER — KETOROLAC TROMETHAMINE 30 MG/ML IJ SOLN
INTRAMUSCULAR | Status: AC
  Administered 2018-04-26: 30 mg via INTRAVENOUS
  Filled 2018-04-26: qty 1

## 2018-04-26 NOTE — ED Triage Notes (Signed)
PT reports that she has had a sore throat and was given antibiotics. She is unable to swallow or eat because of her sore throat. Tonsil and uvula are swollen.

## 2018-04-26 NOTE — H&P (Signed)
[facility] [place] NAME: Brandi Douglas    MR#:  [id number]  DATE OF BIRTH:  08-11-1950  DATE OF ADMISSION:  04/26/2018  PRIMARY CARE PHYSICIAN: Crecencio Mc, MD   REQUESTING/REFERRING PHYSICIAN: Dr. Larae Grooms  CHIEF COMPLAINT:   Chief Complaint  Patient presents with  . Sore Throat    HISTORY OF PRESENT ILLNESS:  Brandi Douglas  is a 67 y.o. female with a known history of chronic fatigue syndrome, IBS, general anxiety who presents to the hospital due to sore throat and difficulty swallowing.  Patient says she was in her usual state of health until this past Monday she developed a sore throat which has progressively gotten worse and she is been having difficulty swallowing even liquids and solids as she starts choking on them.  She went to see her primary care physician and she was started on some Zithromax, she took 2 pills of Zithromax but has not improved and therefore came to the ER for further evaluation.  Emergency room patient was noted to have a fever of 102, tachycardia and underwent a CT of her neck which showed evidence of pharyngitis with no acute fluid flexion consistent with abscess.  Given her fever, tachycardia and not improving with outpatient therapy hospitalist services were contacted for admission.  Patient's rapid strep is negative.  PAST MEDICAL HISTORY:   Past Medical History:  Diagnosis Date  . Chronic fatigue syndrome   . Generalized anxiety disorder   . Irritable bowel syndrome     PAST SURGICAL HISTORY:   Past Surgical History:  Procedure Laterality Date  . CHOLECYSTECTOMY      SOCIAL HISTORY:   Social History   Tobacco Use  . Smoking status: Never Smoker  . Smokeless tobacco: Never Used  Substance Use Topics  . Alcohol use: No    FAMILY HISTORY:   Family History  Problem Relation Age of Onset  . Hypothyroidism Mother   . Hypertension Father     DRUG ALLERGIES:   Allergies  Allergen  Reactions  . Aspirin Other (See Comments)    Burns stomach  . Dexilant [Dexlansoprazole]     nightmares  . Lubiprostone Nausea Only    REVIEW OF SYSTEMS:   Review of Systems  Constitutional: Negative for fever and weight loss.  HENT: Positive for sore throat. Negative for congestion, nosebleeds and tinnitus.   Eyes: Negative for blurred vision, double vision and redness.  Respiratory: Negative for cough, hemoptysis and shortness of breath.   Cardiovascular: Negative for chest pain, orthopnea, leg swelling and PND.  Gastrointestinal: Negative for abdominal pain, diarrhea, melena, nausea and vomiting.  Genitourinary: Negative for dysuria, hematuria and urgency.  Musculoskeletal: Negative for falls and joint pain.  Neurological: Negative for dizziness, tingling, sensory change, focal weakness, seizures, weakness and headaches.  Endo/Heme/Allergies: Negative for polydipsia. Does not bruise/bleed easily.  Psychiatric/Behavioral: Negative for depression and memory loss. The patient is not nervous/anxious.     MEDICATIONS AT HOME:   Prior to Admission medications   Medication Sig Start Date End Date Taking? Authorizing Provider  ALPRAZolam Duanne Moron) 0.5 MG tablet Take 0.5-1 tablets (0.25-0.5 mg total) by mouth 2 (two) times daily as needed. For anxiety 04/18/18  Yes Crecencio Mc, MD  azithromycin (ZITHROMAX) 250 MG tablet 2 pills today and 1 pill day 2-5 with food 04/24/18  Yes McLean-Scocuzza, Nino Glow, MD  DULoxetine (CYMBALTA) 60 MG capsule Take 60 mg by mouth daily.    Yes  [provider]  lisdexamfetamine (VYVANSE) 20 MG capsule Take 1 capsule by mouth daily.    Yes [provider]  ondansetron (ZOFRAN) 8 MG tablet Take 1 tablet (8 mg total) by mouth every 8 (eight) hours as needed. For nausea 06/22/17  Yes Crecencio Mc, MD  SUMAtriptan (IMITREX) 100 MG tablet TAKE 1 TABLET BY MOUTH AT ONSET OF HEADACHE. MAY TAKE AN ADDITIONAL TABLET IN 2 HOURS IF NEEDED. MAX  2/24hr. 07/06/16  Yes Crecencio Mc, MD  tolterodine (DETROL LA) 4 MG 24 hr capsule Take 4 mg by mouth daily.   Yes [provider]      VITAL SIGNS:  Blood pressure 107/80, pulse (!) 126, temperature 99.3 F (37.4 C), temperature source Oral, resp. rate 18, height 5\' 6"  (1.676 m), weight 74.2 kg, SpO2 99 %.  PHYSICAL EXAMINATION:  Physical Exam  GENERAL:  67 y.o.-year-old patient lying in the bed in no acute distress.  EYES: Pupils equal, round, reactive to light and accommodation. No scleral icterus. Extraocular muscles intact.  HEENT: Head atraumatic, normocephalic.  Patient's oropharyngeal area is slightly inflamed, no uvula deviation or any evidence of exudates noted on the tonsils.   NECK: Patient has some tender submandibular glands, no jugular venous distention. No thyroid enlargement LUNGS: Normal breath sounds bilaterally, no wheezing, rales, rhonchi. No use of accessory muscles of respiration.  CARDIOVASCULAR: S1, S2 RRR. No murmurs, rubs, gallops, clicks.  ABDOMEN: Soft, nontender, nondistended. Bowel sounds present. No organomegaly or mass.  EXTREMITIES: No pedal edema, cyanosis, or clubbing. + 2 pedal & radial pulses b/l.   NEUROLOGIC: Cranial nerves II through XII are intact. No focal Motor or sensory deficits appreciated b/l PSYCHIATRIC: The patient is alert and oriented x 3.  SKIN: No obvious rash, lesion, or ulcer.   LABORATORY PANEL:   CBC Recent Labs  Lab 04/26/18 1227  WBC 10.8*  HGB 13.4  HCT 38.7  PLT 165   ------------------------------------------------------------------------------------------------------------------  Chemistries  Recent Labs  Lab 04/24/18 0919 04/26/18 1227  NA 139 140  K 4.4 3.8  CL 105 104  CO2 28 24  GLUCOSE 98 139*  BUN 14 17  CREATININE 0.80 0.78  CALCIUM 9.5 9.5  AST 16  --   ALT 16  --   ALKPHOS 83  --   BILITOT 0.8  --     ------------------------------------------------------------------------------------------------------------------  Cardiac Enzymes No results for input(s): TROPONINI in the last 168 hours. ------------------------------------------------------------------------------------------------------------------  RADIOLOGY:  Ct Soft Tissue Neck W Contrast  Result Date: 04/26/2018 CLINICAL DATA:  Sore throat worsening over the last 3 days. Unable to swallow. EXAM: CT NECK WITH CONTRAST TECHNIQUE: Multidetector CT imaging of the neck was performed using the standard protocol following the bolus administration of intravenous contrast. CONTRAST:  40mL OMNIPAQUE IOHEXOL 300 MG/ML  SOLN COMPARISON:  None. FINDINGS: Pharynx and larynx: There appears to be mucosal thickening of the posterior nasopharynx, oropharynx and hypopharynx consistent with pharyngitis. No sign of abscess. No sign of critical airway compromise. No evidence deep space abscess. Question tiny amount of retropharyngeal effusion. Salivary glands: Parotid and submandibular glands are normal. Thyroid: Normal Lymph nodes: Slightly prominent level 2 nodes consistent with reactive inflammation. No evidence of suppuration. Vascular: Normal Limited intracranial: Normal Visualized orbits: Normal Mastoids and visualized paranasal sinuses: Clear/normal Skeleton: Ordinary cervical spondylosis. Upper chest: Benign appearing scarring at both apices. Other: None IMPRESSION: Pharyngitis pattern with mucosal prominence but no evidence of advanced disease or drainable abscess. There may be a very small retropharyngeal  effusion but no evidence of loculated collection or abscess. Mild reactive adenopathy of the level 2 nodes without suppuration. Electronically Signed   By: Nelson Chimes M.D.   On: 04/26/2018 14:18     IMPRESSION AND PLAN:   67 year old female with past medical history of anxiety, chronic fatigue syndrome who presents to the hospital due to sore  throat and difficulty swallowing and noted to have pharyngitis.  1.  Sepsis-patient meets criteria given her fever, tachycardia and CT scan findings suggestive of pharyngitis but no abscess. - We will treat the patient with IV Unasyn, follow cultures, follow hemodynamics.  2.  Acute pharyngitis-this is a source of patient's sepsis.  Patient has failed outpatient oral therapy with Zithromax. - We will admit the patient and start her on IV Unasyn, also IV Decadron for the swelling.  If not improving would consider getting ENT evaluation.  3.  Anxiety-continue Xanax  4.  Chronic fatigue syndrome-continue Cymbalta, Vyvanse.  5.  Urinary incontinence-continue Detrol LA    All the records are reviewed and case discussed with ED provider. Management plans discussed with the patient, family and they are in agreement.  CODE STATUS: Full code  TOTAL TIME TAKING CARE OF THIS PATIENT: 45 minutes.    Henreitta Leber M.D on 04/26/2018 at 5:43 PM  Between 7am to 6pm - Pager - [phone]  After 6pm go to [email or web address] - password [email or web address] [facility]  Office  [phone]  CC: Primary care physician; Crecencio Mc, MD

## 2018-04-26 NOTE — Consult Note (Signed)
Pharmacy Antibiotic Note  Brandi Douglas is a 67 y.o. female admitted on 04/26/2018 with pharyngitis.  Pharmacy has been consulted for Unasyn dosing.  Plan: Unasyn 3 gm IV every 6 hours  Height: 5\' 6"  (167.6 cm) Weight: 163 lb 9.6 oz (74.2 kg) IBW/kg (Calculated) : 59.3  Temp (24hrs), Avg:100.2 F (37.9 C), Min:98.6 F (37 C), Max:102.1 F (38.9 C)  Recent Labs  Lab 04/24/18 0919 04/26/18 1227 04/26/18 1541  WBC 7.6 10.8*  --   CREATININE 0.80 0.78  --   LATICACIDVEN  --   --  1.4    Estimated Creatinine Clearance: 72.3 mL/min (by C-G formula based on SCr of 0.78 mg/dL).    Allergies  Allergen Reactions  . Aspirin Other (See Comments)    Burns stomach  . Dexilant [Dexlansoprazole]     nightmares  . Lubiprostone Nausea Only    Antimicrobials this admission: Unasyn 10/24 >>    Dose adjustments this admission:   Microbiology results: 10/24 BCx: pending  UCx:    Sputum:    MRSA PCR:  10/24 GAS: not detected  Thank you for allowing pharmacy to be a part of this patient's care.  Forrest Moron, PharmD 04/26/2018 5:33 PM

## 2018-04-26 NOTE — Progress Notes (Signed)
   Subjective:    Patient ID: Brandi Douglas, female    DOB: Feb 29, 1951, 67 y.o.   MRN: [id number]  HPI   Patient presents to clinic due to worsening sore throat, feeling like she cannot swallow, extreme fatigue and achiness.  Patient was seen on 22 October and diagnosed with uvulitis, has been treated with 2 doses of azithromycin, but patient has been states she can no longer swallow the antibiotic or her other medications.  Patient states even when she tries to swallow water it comes right back up.  Patient states "I feel I am dying".  Rapid strep and rapid flu were done on 22 October and both were negative.  Offered to repeat these tests in clinic, with patient and husband declined.  Patient states she really has not been able to eat or drink anything in 2 days, husband states she has lost 5 pounds in 2 days as well.  Patient Active Problem List   Diagnosis Date Noted  . Degenerative arthritis of carpometacarpal joint of thumb 04/18/2017  . GERD (gastroesophageal reflux disease) 02/24/2015  . Visit for preventive health examination 11/13/2011  . Chronic fatigue syndrome 10/10/2011  . Generalized anxiety disorder 10/10/2011  . Irritable bowel syndrome (IBS) 10/10/2011  . Headache, migraine 10/10/2011   Social History   Tobacco Use  . Smoking status: Never Smoker  . Smokeless tobacco: Never Used  Substance Use Topics  . Alcohol use: No   Review of Systems  Constitutional:  +chills, fatigue and fever.  HENT: +congestion & sore throat.    Eyes: Negative.   Respiratory: Negative for cough, shortness of breath and wheezing.   Cardiovascular: Negative for chest pain, palpitations and leg swelling.  Gastrointestinal: Negative for abdominal pain, diarrhea, nausea and vomiting.  Genitourinary: Negative for dysuria, frequency and urgency.  Musculoskeletal: +aches all over Skin: Negative for color change, pallor and rash.  Neurological: Negative for syncope, light-headedness and headaches.    Psychiatric/Behavioral: The patient is not nervous/anxious.       Objective:   Physical Exam  Constitutional: She is oriented to person, place, and time. She appears ill.  HENT:  Head: Normocephalic and atraumatic.  Mouth/Throat: Uvula swelling present. Posterior oropharyngeal edema and posterior oropharyngeal erythema present.  Eyes: EOM are normal.  Cardiovascular: Normal heart sounds.  No murmur heard. Tachycardia 112  Pulmonary/Chest: Effort normal and breath sounds normal. No respiratory distress. She has no wheezes. She has no rales.  Lymphadenopathy:    She has cervical adenopathy.  Neurological: She is alert and oriented to person, place, and time.  Skin:  Patient does appear a little pale  Nursing note and vitals reviewed.     Vitals:   04/26/18 1139  BP: 100/68  Pulse: (!) 112  Temp: 98.6 F (37 C)  SpO2: 97%   Assessment & Plan:   Uvulitis, swallowing impairment, severe fatigue - due to patient stating she cannot even swallow water & has not ate or drank in 2 days I have advised her and husband they would be best that she go to emergency room.  Offered to do injection of IM steroid in clinic, but they declined and will head to ER.   Follow-up with PCP after ER visit.

## 2018-04-26 NOTE — ED Provider Notes (Addendum)
[facility] Emergency Department Provider Note  ___________________________________________   First MD Initiated Contact with Patient 04/26/18 1459     (approximate)  I have reviewed the triage vital signs and the nursing notes.   HISTORY  Chief Complaint Sore Throat   HPI Brandi Douglas is a 67 y.o. female with a history of anxiety as well as GERD who was presented to the emergency department today with sore throat.  She says that she was seen in her primary care doctor's on the 22nd and was discharged with diclofenac as well as azithromycin.  She was able to take the azithromycin the first 2 days but was unable to tolerate today secondary to increased throat pain especially with swallowing.  Says that she also has had a mild cough and runny nose.  Denies any difficulty breathing.  No known sick contacts.  Says that she has been tested for strep with a rapid strep test which was negative.  Also says that she tested negative for flu.   Past Medical History:  Diagnosis Date  . Chronic fatigue syndrome   . Generalized anxiety disorder   . Irritable bowel syndrome     Patient Active Problem List   Diagnosis Date Noted  . Degenerative arthritis of carpometacarpal joint of thumb 04/18/2017  . GERD (gastroesophageal reflux disease) 02/24/2015  . Visit for preventive health examination 11/13/2011  . Chronic fatigue syndrome 10/10/2011  . Generalized anxiety disorder 10/10/2011  . Irritable bowel syndrome (IBS) 10/10/2011  . Headache, migraine 10/10/2011    Past Surgical History:  Procedure Laterality Date  . CHOLECYSTECTOMY      Prior to Admission medications   Medication Sig Start Date End Date Taking? Authorizing Provider  ALPRAZolam Duanne Moron) 0.5 MG tablet Take 0.5-1 tablets (0.25-0.5 mg total) by mouth 2 (two) times daily as needed. For anxiety 04/18/18   Crecencio Mc, MD  azithromycin (ZITHROMAX) 250 MG tablet 2 pills today and 1 pill day 2-5 with  food 04/24/18   McLean-Scocuzza, Nino Glow, MD  DULoxetine (CYMBALTA) 60 MG capsule Take by mouth.    [provider]  lisdexamfetamine (VYVANSE) 20 MG capsule Take 1 capsule by mouth daily.     [provider]  ondansetron (ZOFRAN) 8 MG tablet Take 1 tablet (8 mg total) by mouth every 8 (eight) hours as needed. For nausea 06/22/17   Crecencio Mc, MD  SUMAtriptan (IMITREX) 100 MG tablet TAKE 1 TABLET BY MOUTH AT ONSET OF HEADACHE. MAY TAKE AN ADDITIONAL TABLET IN 2 HOURS IF NEEDED. MAX 2/24hr. 07/06/16   Crecencio Mc, MD  tolterodine (DETROL LA) 4 MG 24 hr capsule Take 4 mg by mouth daily.    [provider]    Allergies Aspirin; Dexilant [dexlansoprazole]; and Lubiprostone  History reviewed. No pertinent family history.  Social History Social History   Tobacco Use  . Smoking status: Never Smoker  . Smokeless tobacco: Never Used  Substance Use Topics  . Alcohol use: No  . Drug use: No    Review of Systems  Constitutional: Positive for fever and chills. Eyes: No visual changes. ENT: As above Cardiovascular: Denies chest pain. Respiratory: Denies shortness of breath. Gastrointestinal: No abdominal pain.  No nausea, no vomiting.  No diarrhea.  No constipation. Genitourinary: Negative for dysuria. Musculoskeletal: Negative for back pain. Skin: Negative for rash. Neurological: Negative for headaches, focal weakness or numbness.   ____________________________________________   PHYSICAL EXAM:  VITAL SIGNS: ED Triage Vitals  Enc Vitals Group  BP 04/26/18 1213 118/71     Pulse Rate 04/26/18 1213 (!) 110     Resp 04/26/18 1213 14     Temp 04/26/18 1213 (!) 100.6 F (38.1 C)     Temp Source 04/26/18 1213 Oral     SpO2 04/26/18 1213 97 %     Weight 04/26/18 1219 163 lb 9.6 oz (74.2 kg)     Height 04/26/18 1219 5\' 6"  (1.676 m)     Head Circumference --      Peak Flow --      Pain Score 04/26/18 1219 10     Pain Loc --      Pain Edu? --       Excl. in [place]? --     Constitutional: Alert and oriented. Well appearing and in no acute distress. Eyes: Conjunctivae are normal.  Head: Atraumatic. Nose: No congestion/rhinnorhea. Mouth/Throat: Mucous membranes are moist.  Posterior pharynx is erythematous with mildly swollen tonsils as well as uvula but without kissing tonsils.  Exudates seen over the left tonsil. Neck: No stridor.   Cardiovascular: Tachycardic.  Rate in the room is 122 bpm.  regular rhythm. Grossly normal heart sounds.   Respiratory: Normal respiratory effort.  No retractions. Lungs CTAB. Gastrointestinal: Soft and nontender. No distention.  Musculoskeletal: No lower extremity tenderness nor edema.  No joint effusions. Neurologic:  Normal speech and language. No gross focal neurologic deficits are appreciated. Skin:  Skin is warm, dry and intact. No rash noted. Psychiatric: Mood and affect are normal. Speech and behavior are normal.  ____________________________________________   LABS (all labs ordered are listed, but only abnormal results are displayed)  Labs Reviewed  CBC WITH DIFFERENTIAL/PLATELET - Abnormal; Notable for the following components:      Result Value   WBC 10.8 (*)    Neutro Abs 10.0 (*)    Lymphs Abs 0.2 (*)    All other components within normal limits  BASIC METABOLIC PANEL - Abnormal; Notable for the following components:   Glucose, Bld 139 (*)    All other components within normal limits  CULTURE, BLOOD (ROUTINE X 2)  CULTURE, BLOOD (ROUTINE X 2)  GROUP A STREP BY PCR  LACTIC ACID, PLASMA  URINALYSIS, COMPLETE (UACMP) WITH MICROSCOPIC  URINALYSIS, ROUTINE W REFLEX MICROSCOPIC  LACTIC ACID, PLASMA   ____________________________________________  EKG  ED ECG REPORT I, Doran Stabler, the attending physician, personally viewed and interpreted this ECG.   Date: 04/26/2018  EKG Time: 1603  Rate: 118  Rhythm: sinus tachycardia  Axis: Normal  Intervals:none  ST&T Change: Minimal  ST depressions diffusely likely related to demand from tachycardia.  ____________________________________________  RADIOLOGY  CT soft tissue of the neck with pharyngitis pattern with mucosal prominence but no evidence of advanced disease or drainable abscess. ____________________________________________   PROCEDURES  Procedure(s) performed:   Procedures  Critical Care performed:   ____________________________________________   INITIAL IMPRESSION / ASSESSMENT AND PLAN / ED COURSE  Pertinent labs & imaging results that were available during my care of the patient were reviewed by me and considered in my medical decision making (see chart for details).  DDX: Strep pharyngitis, viral pharyngitis, mononucleosis, peritonsillar abscess, uvulitis, epiglottitis, deep space abscess of the throat. As part of my medical decision making, I reviewed the following data within the [place] recent primary care notes.  Patient with failure of outpatient antibiotics we will give IV medicines as well as steroids.  Probable admission to the hospital.  Sepsis alert called.  -----------------------------------------  4:55 PM on 04/26/2018 -----------------------------------------  Despite first dose of Unasyn, Toradol as well as Decadron the patient says that she is still unable to take sips of water without having a coughing fit.  Still tachycardic to the 120s.  Will be admitted to the hospital.  Signed out to Dr. Vianne Bulls.  Patient with a diagnosis post treatment and willing to comply.  Despite this, patient says that she feels slightly improved.  Controlling her secretions.  No respiratory distress. ____________________________________________   FINAL CLINICAL IMPRESSION(S) / ED DIAGNOSES  Pharyngitis.  Sepsis.  NEW MEDICATIONS STARTED DURING THIS VISIT:  New Prescriptions   No medications on file     Note:  This document was prepared using Dragon voice  recognition software and may include unintentional dictation errors.     Orbie Pyo, MD 04/26/18 1656    Clearnce Hasten Randall An, MD 04/26/18 [phone]

## 2018-04-26 NOTE — ED Notes (Signed)
Pt unable to provide urine sample at this time. Aware of need for sample.

## 2018-04-26 NOTE — Progress Notes (Signed)
CODE SEPSIS - PHARMACY COMMUNICATION  **Broad Spectrum Antibiotics should be administered within 1 hour of Sepsis diagnosis**  Time Code Sepsis Called/Page Received: 1535  Antibiotics Ordered: Unasyn  Time of 1st antibiotic administration: 1620  Additional action taken by pharmacy: n/a  If necessary, Name of Provider/Nurse Contacted: [place] ,PharmD Clinical Pharmacist  04/26/2018  3:41 PM

## 2018-04-27 LAB — BASIC METABOLIC PANEL
Anion gap: 9 (ref 5–15)
BUN: 17 mg/dL (ref 8–23)
CO2: 25 mmol/L (ref 22–32)
Calcium: 9.1 mg/dL (ref 8.9–10.3)
Chloride: 108 mmol/L (ref 98–111)
Creatinine, Ser: 0.74 mg/dL (ref 0.44–1.00)
Glucose, Bld: 171 mg/dL — ABNORMAL HIGH (ref 70–99)
POTASSIUM: 3.6 mmol/L (ref 3.5–5.1)
SODIUM: 142 mmol/L (ref 135–145)

## 2018-04-27 LAB — CBC
HCT: 33.3 % — ABNORMAL LOW (ref 36.0–46.0)
Hemoglobin: 11.3 g/dL — ABNORMAL LOW (ref 12.0–15.0)
MCH: 31.4 pg (ref 26.0–34.0)
MCHC: 33.9 g/dL (ref 30.0–36.0)
MCV: 92.5 fL (ref 80.0–100.0)
NRBC: 0 % (ref 0.0–0.2)
Platelets: 165 10*3/uL (ref 150–400)
RBC: 3.6 MIL/uL — ABNORMAL LOW (ref 3.87–5.11)
RDW: 12 % (ref 11.5–15.5)
WBC: 11.1 10*3/uL — AB (ref 4.0–10.5)

## 2018-04-27 MED ORDER — PHENOL 1.4 % MT LIQD
1.0000 | OROMUCOSAL | Status: DC | PRN
  Administered 2018-04-27: 1 via OROMUCOSAL
  Filled 2018-04-27: qty 177

## 2018-04-27 MED ORDER — KETOROLAC TROMETHAMINE 30 MG/ML IJ SOLN
30.0000 mg | Freq: Once | INTRAMUSCULAR | Status: AC
  Administered 2018-04-27: 30 mg via INTRAVENOUS
  Filled 2018-04-27: qty 1

## 2018-04-27 MED ORDER — KETOROLAC TROMETHAMINE 30 MG/ML IJ SOLN
30.0000 mg | Freq: Three times a day (TID) | INTRAMUSCULAR | Status: DC | PRN
  Administered 2018-04-27 (×2): 30 mg via INTRAVENOUS
  Filled 2018-04-27 (×2): qty 1

## 2018-04-27 NOTE — Progress Notes (Signed)
[place] at [facility]                                                                                                                                                                                  Patient Demographics   Brandi Douglas, is a 67 y.o. female, DOB - 1951/07/03, HWT:[id number]  Admit date - 04/26/2018   Admitting Physician Henreitta Leber, MD  Outpatient Primary MD for the patient is Crecencio Mc, MD   LOS - 1  Subjective: Patient continues to have sore throat and difficulty with eating.  States that when she eats hot foods or cold foods makes her throat hurt    Review of Systems:   CONSTITUTIONAL: No documented fever. No fatigue, weakness. No weight gain, no weight loss.  EYES: No blurry or double vision.  ENT: No tinnitus.  Positive postnasal drip.  Positive redness of the oropharynx.  Positive sore throat RESPIRATORY: No cough, no wheeze, no hemoptysis. No dyspnea.  CARDIOVASCULAR: No chest pain. No orthopnea. No palpitations. No syncope.  GASTROINTESTINAL: No nausea, no vomiting or diarrhea. No abdominal pain. No melena or hematochezia.  GENITOURINARY: No dysuria or hematuria.  ENDOCRINE: No polyuria or nocturia. No heat or cold intolerance.  HEMATOLOGY: No anemia. No bruising. No bleeding.  INTEGUMENTARY: No rashes. No lesions.  MUSCULOSKELETAL: No arthritis. No swelling. No gout.  NEUROLOGIC: No numbness, tingling, or ataxia. No seizure-type activity.  PSYCHIATRIC: No anxiety. No insomnia. No ADD.    Vitals:   Vitals:   04/26/18 1848 04/26/18 1923 04/27/18 0347 04/27/18 1251  BP: 139/74 136/75 118/76 109/71  Pulse: (!) 125 (!) 118 85 (!) 114  Resp: 18 18 17 18   Temp: 98 F (36.7 C) 99.8 F (37.7 C) 98.9 F (37.2 C) 98.1 F (36.7 C)  TempSrc: Oral Oral Oral Oral  SpO2: 96% 96% 99% 97%  Weight: 75.6 kg     Height: 5\' 6"  (1.676 m)       Wt Readings from Last 3 Encounters:  04/26/18 75.6 kg  04/26/18 74.2 kg  04/24/18  76.2 kg     Intake/Output Summary (Last 24 hours) at 04/27/2018 1348 Last data filed at 04/27/2018 1346 Gross per 24 hour  Intake 1346.42 ml  Output -  Net 1346.42 ml    Physical Exam:   GENERAL: Pleasant-appearing in no apparent distress.  HEAD, EYES, EARS, NOSE AND THROAT: Atraumatic, normocephalic. Extraocular muscles are intact. Pupils equal and reactive to light. Sclerae anicteric. No conjunctival injection.  Positive oro-pharyngeal erythema.  NECK: Supple. There is no jugular venous distention. No bruits, no lymphadenopathy, no thyromegaly.  HEART: Regular rate and rhythm,. No murmurs, no rubs,  no clicks.  LUNGS: Clear to auscultation bilaterally. No rales or rhonchi. No wheezes.  ABDOMEN: Soft, flat, nontender, nondistended. Has good bowel sounds. No hepatosplenomegaly appreciated.  EXTREMITIES: No evidence of any cyanosis, clubbing, or peripheral edema.  +2 pedal and radial pulses bilaterally.  NEUROLOGIC: The patient is alert, awake, and oriented x3 with no focal motor or sensory deficits appreciated bilaterally.  SKIN: Moist and warm with no rashes appreciated.  Psych: Not anxious, depressed LN: No inguinal LN enlargement    Antibiotics   Anti-infectives (From admission, onward)   Start     Dose/Rate Route Frequency Ordered Stop   04/26/18 2230  Ampicillin-Sulbactam (UNASYN) 3 g in sodium chloride 0.9 % 100 mL IVPB     3 g 200 mL/hr over 30 Minutes Intravenous Every 6 hours 04/26/18 1732     04/26/18 1515  Ampicillin-Sulbactam (UNASYN) 3 g in sodium chloride 0.9 % 100 mL IVPB     3 g 200 mL/hr over 30 Minutes Intravenous  Once 04/26/18 1507 04/26/18 1657      Medications   Scheduled Meds: . dexamethasone  4 mg Intravenous Q6H  . DULoxetine  60 mg Oral Daily  . enoxaparin (LOVENOX) injection  40 mg Subcutaneous Q24H  . fesoterodine  4 mg Oral Daily  . lisdexamfetamine  20 mg Oral Daily   Continuous Infusions: . ampicillin-sulbactam (UNASYN) IV Stopped  (04/27/18 0924)   PRN Meds:.acetaminophen **OR** acetaminophen, ALPRAZolam, ketorolac, ondansetron **OR** ondansetron (ZOFRAN) IV   Data Review:   Micro Results Recent Results (from the past 240 hour(s))  Blood Culture (routine x 2)     Status: None (Preliminary result)   Collection Time: 04/26/18  3:42 PM  Result Value Ref Range Status   Specimen Description BLOOD BLOOD RIGHT FOREARM  Final   Special Requests   Final    BOTTLES DRAWN AEROBIC AND ANAEROBIC Blood Culture adequate volume   Culture   Final    NO GROWTH < 24 HOURS Performed at [facility], [address] [place], [place] [address]    Report Status PENDING  Incomplete  Blood Culture (routine x 2)     Status: None (Preliminary result)   Collection Time: 04/26/18  3:42 PM  Result Value Ref Range Status   Specimen Description BLOOD LEFT ANTECUBITAL  Final   Special Requests   Final    BOTTLES DRAWN AEROBIC AND ANAEROBIC Blood Culture adequate volume   Culture   Final    NO GROWTH < 24 HOURS Performed at [facility], [address], [place], [place] [address]    Report Status PENDING  Incomplete  Group A Strep by PCR ([place] Only)     Status: None   Collection Time: 04/26/18  3:42 PM  Result Value Ref Range Status   Group A Strep by PCR NOT DETECTED NOT DETECTED Final    Comment: Performed at [facility], [address], [place], [place] [address]    Radiology Reports Ct Soft Tissue Neck W Contrast  Result Date: 04/26/2018 CLINICAL DATA:  Sore throat worsening over the last 3 days. Unable to swallow. EXAM: CT NECK WITH CONTRAST TECHNIQUE: Multidetector CT imaging of the neck was performed using the standard protocol following the bolus administration of intravenous contrast. CONTRAST:  80mL OMNIPAQUE IOHEXOL 300 MG/ML  SOLN COMPARISON:  None. FINDINGS: Pharynx and larynx: There appears to be mucosal thickening of the posterior nasopharynx, oropharynx and hypopharynx consistent with  pharyngitis. No sign of abscess. No sign of critical airway  compromise. No evidence deep space abscess. Question tiny amount of retropharyngeal effusion. Salivary glands: Parotid and submandibular glands are normal. Thyroid: Normal Lymph nodes: Slightly prominent level 2 nodes consistent with reactive inflammation. No evidence of suppuration. Vascular: Normal Limited intracranial: Normal Visualized orbits: Normal Mastoids and visualized paranasal sinuses: Clear/normal Skeleton: Ordinary cervical spondylosis. Upper chest: Benign appearing scarring at both apices. Other: None IMPRESSION: Pharyngitis pattern with mucosal prominence but no evidence of advanced disease or drainable abscess. There may be a very small retropharyngeal effusion but no evidence of loculated collection or abscess. Mild reactive adenopathy of the level 2 nodes without suppuration. Electronically Signed   By: Nelson Chimes M.D.   On: 04/26/2018 14:18     CBC Recent Labs  Lab 04/24/18 0919 04/26/18 1227 04/27/18 0533  WBC 7.6 10.8* 11.1*  HGB 13.0 13.4 11.3*  HCT 37.1 38.7 33.3*  PLT 166.0 165 165  MCV 92.2 91.5 92.5  MCH  --  31.7 31.4  MCHC 35.0 34.6 33.9  RDW 12.3 12.1 12.0  LYMPHSABS 1.1 0.2*  --   MONOABS 0.5 0.5  --   EOSABS 0.1 0.0  --   BASOSABS 0.1 0.0  --     Chemistries  Recent Labs  Lab 04/24/18 0919 04/26/18 1227 04/27/18 0533  NA 139 140 142  K 4.4 3.8 3.6  CL 105 104 108  CO2 28 24 25   GLUCOSE 98 139* 171*  BUN 14 17 17   CREATININE 0.80 0.78 0.74  CALCIUM 9.5 9.5 9.1  AST 16  --   --   ALT 16  --   --   ALKPHOS 83  --   --   BILITOT 0.8  --   --    ------------------------------------------------------------------------------------------------------------------ estimated creatinine clearance is 72.8 mL/min (by C-G formula based on SCr of 0.74 mg/dL). ------------------------------------------------------------------------------------------------------------------ No results for input(s):  HGBA1C in the last 72 hours. ------------------------------------------------------------------------------------------------------------------ No results for input(s): CHOL, HDL, LDLCALC, TRIG, CHOLHDL, LDLDIRECT in the last 72 hours. ------------------------------------------------------------------------------------------------------------------ No results for input(s): TSH, T4TOTAL, T3FREE, THYROIDAB in the last 72 hours.  Invalid input(s): FREET3 ------------------------------------------------------------------------------------------------------------------ No results for input(s): VITAMINB12, FOLATE, FERRITIN, TIBC, IRON, RETICCTPCT in the last 72 hours.  Coagulation profile No results for input(s): INR, PROTIME in the last 168 hours.  No results for input(s): DDIMER in the last 72 hours.  Cardiac Enzymes No results for input(s): CKMB, TROPONINI, MYOGLOBIN in the last 168 hours.  Invalid input(s): CK ------------------------------------------------------------------------------------------------------------------ Invalid input(s): POCBNP    Assessment & Plan   67 year old female with past medical history of anxiety, chronic fatigue syndrome who presents to the hospital due to sore throat and difficulty swallowing and noted to have pharyngitis.  1.  Sepsis- due to pharyngitis - Continue IV Unasyn, follow cultures, follow hemodynamics.  2.  Acute pharyngitis-this is a source of patient's sepsis.  Patient has failed outpatient oral therapy with Zithromax. Continue IV Unasyn and Decadron We will be able to change patient to oral Augmentin tomorrow discharge home  3.  Anxiety-continue Xanax  4.  Chronic fatigue syndrome-continue Cymbalta, Vyvanse.  5.  Urinary incontinence-continue Detrol LA     Code Status Orders  (From admission, onward)         Start     Ordered   04/26/18 1836  Full code  Continuous     04/26/18 1836        Code Status History    This  patient has a current code status but no historical code status.  Advance Directive Documentation     Most Recent Value  Type of Advance Directive  Healthcare Power of Attorney  Pre-existing out of facility DNR order (yellow form or pink MOST form)  -  "MOST" Form in Place?  -           Consults none  DVT Prophylaxis  Lovenox   Lab Results  Component Value Date   PLT 165 04/27/2018     Time Spent in minutes 35 minutes  Greater than 50% of time spent in care coordination and counseling patient regarding the condition and plan of care.   Dustin Flock M.D on 04/27/2018 at 1:48 PM  Between 7am to 6pm - Pager - [phone]  After 6pm go to [email or web address] - Proofreader  [employer]   Office  [phone]

## 2018-04-27 NOTE — Consult Note (Signed)
Pharmacy Antibiotic Note  Brandi Douglas is a 67 y.o. female admitted on 04/26/2018 with pharyngitis.  Pharmacy has been consulted for Unasyn dosing.  Plan: Continue Unasyn 3 gm IV every 6 hours  Height: 5\' 6"  (167.6 cm) Weight: 166 lb 10.7 oz (75.6 kg) IBW/kg (Calculated) : 59.3  Temp (24hrs), Avg:99.6 F (37.6 C), Min:98 F (36.7 C), Max:102.1 F (38.9 C)  Recent Labs  Lab 04/24/18 0919 04/26/18 1227 04/26/18 1541 04/27/18 0533  WBC 7.6 10.8*  --  11.1*  CREATININE 0.80 0.78  --  0.74  LATICACIDVEN  --   --  1.4  --     Estimated Creatinine Clearance: 72.8 mL/min (by C-G formula based on SCr of 0.74 mg/dL).    Allergies  Allergen Reactions  . Aspirin Other (See Comments)    Burns stomach  . Dexilant [Dexlansoprazole]     nightmares  . Lubiprostone Nausea Only    Antimicrobials this admission: Unasyn 10/24 >>    Dose adjustments this admission:   Microbiology results: 10/24 BCx: pending 10/24 GAS: not detected  Thank you for allowing pharmacy to be a part of this patient's care.  Pernell Dupre, PharmD, BCPS Clinical Pharmacist 04/27/2018 9:28 AM

## 2018-04-28 LAB — HIV ANTIBODY (ROUTINE TESTING W REFLEX): HIV SCREEN 4TH GENERATION: NONREACTIVE

## 2018-04-28 MED ORDER — LEVOFLOXACIN 500 MG PO TABS
500.0000 mg | ORAL_TABLET | Freq: Every day | ORAL | Status: DC
  Administered 2018-04-28: 500 mg via ORAL
  Filled 2018-04-28: qty 1

## 2018-04-28 MED ORDER — LEVOFLOXACIN 500 MG PO TABS: 500.0000 mg | ORAL_TABLET | Freq: Every day | ORAL | 0 refills | Status: DC

## 2018-04-28 MED ORDER — PREDNISONE 10 MG PO TABS: ORAL_TABLET | ORAL | 0 refills | Status: DC

## 2018-04-28 MED ORDER — PREDNISONE 20 MG PO TABS
40.0000 mg | ORAL_TABLET | Freq: Every day | ORAL | Status: DC
  Administered 2018-04-28: 10:00:00 40 mg via ORAL
  Filled 2018-04-28: qty 2

## 2018-04-28 NOTE — Discharge Instructions (Signed)

## 2018-04-28 NOTE — Discharge Summary (Signed)
[facility] [place] NAME: Brandi Douglas    MR#:  [id number]  DATE OF BIRTH:  August 23, 1950  DATE OF ADMISSION:  04/26/2018 ADMITTING PHYSICIAN: Henreitta Leber, MD  DATE OF DISCHARGE: 04/28/2018 11:22 AM  PRIMARY CARE PHYSICIAN: Crecencio Mc, MD    ADMISSION DIAGNOSIS:  Pharyngitis, unspecified etiology [J02.9]  DISCHARGE DIAGNOSIS:  Active Problems:   Pharyngitis   SECONDARY DIAGNOSIS:   Past Medical History:  Diagnosis Date  . Chronic fatigue syndrome   . Generalized anxiety disorder   . Irritable bowel syndrome     HOSPITAL COURSE:   1.  Clinical sepsis with acute pharyngitis.  Patient had pain and trouble swallowing.  Feels a little bit better.  Able to swallow but still tender.  CT scan did not show any signs of abscess.  Patient was on IV antibiotics and IV Decadron.  Switched over to p.o. prednisone and p.o. Levaquin for completion of course.  Patient has seen Dr. Charolett Bumpers in the past and can go back there if still bothersome.  2.  Anxiety continue psychiatric medications 3.  Chronic fatigue 4.  Urinary incontinence   DISCHARGE CONDITIONS:   Satisfactory  CONSULTS OBTAINED:  None  DRUG ALLERGIES:   Allergies  Allergen Reactions  . Aspirin Other (See Comments)    Burns stomach  . Dexilant [Dexlansoprazole]     nightmares  . Lubiprostone Nausea Only    DISCHARGE MEDICATIONS:   Allergies as of 04/28/2018      Reactions   Aspirin Other (See Comments)   Burns stomach   Dexilant [dexlansoprazole]    nightmares   Lubiprostone Nausea Only      Medication List    STOP taking these medications   azithromycin 250 MG tablet Commonly known as:  ZITHROMAX     TAKE these medications   ALPRAZolam 0.5 MG tablet Commonly known as:  XANAX Take 0.5-1 tablets (0.25-0.5 mg total) by mouth 2 (two) times daily as needed. For anxiety   DULoxetine 60 MG capsule Commonly known as:  CYMBALTA Take 60 mg by mouth  daily.   levofloxacin 500 MG tablet Commonly known as:  LEVAQUIN Take 1 tablet (500 mg total) by mouth daily. Start taking on:  04/29/2018   ondansetron 8 MG tablet Commonly known as:  ZOFRAN Take 1 tablet (8 mg total) by mouth every 8 (eight) hours as needed. For nausea   predniSONE 10 MG tablet Commonly known as:  DELTASONE 4 tabs po day 1; 3 tabs po day2,3; 2 tabs po day 4,5; 1 tabs po day6,7;  1/2 tab po day8.9   SUMAtriptan 100 MG tablet Commonly known as:  IMITREX TAKE 1 TABLET BY MOUTH AT ONSET OF HEADACHE. MAY TAKE AN ADDITIONAL TABLET IN 2 HOURS IF NEEDED. MAX 2/24hr.   tolterodine 4 MG 24 hr capsule Commonly known as:  DETROL LA Take 4 mg by mouth daily.   VYVANSE 20 MG capsule Generic drug:  lisdexamfetamine Take 1 capsule by mouth daily.        DISCHARGE INSTRUCTIONS:   Follow-up PMD 5 days  If you experience worsening of your admission symptoms, develop shortness of breath, life threatening emergency, suicidal or homicidal thoughts you must seek medical attention immediately by calling 911 or calling your MD immediately  if symptoms less severe.  You Must read complete instructions/literature along with all the possible adverse reactions/side effects for all the Medicines you take and that have been prescribed to you. Take any  new Medicines after you have completely understood and accept all the possible adverse reactions/side effects.   Please note  You were cared for by a hospitalist during your hospital stay. If you have any questions about your discharge medications or the care you received while you were in the hospital after you are discharged, you can call the unit and asked to speak with the hospitalist on call if the hospitalist that took care of you is not available. Once you are discharged, your primary care physician will handle any further medical issues. Please note that NO REFILLS for any discharge medications will be authorized once you are  discharged, as it is imperative that you return to your primary care physician (or establish a relationship with a primary care physician if you do not have one) for your aftercare needs so that they can reassess your need for medications and monitor your lab values.    Today   CHIEF COMPLAINT:   Chief Complaint  Patient presents with  . Sore Throat    HISTORY OF PRESENT ILLNESS:  Brandi Douglas  is a 67 y.o. female came in with sore throat   VITAL SIGNS:  Blood pressure (!) 103/54, pulse 79, temperature 98 F (36.7 C), temperature source Oral, resp. rate 18, height 5\' 6"  (1.676 m), weight 75.6 kg, SpO2 98 %.   PHYSICAL EXAMINATION:  GENERAL:  67 y.o.-year-old patient lying in the bed with no acute distress.  EYES: Pupils equal, round, reactive to light and accommodation. No scleral icterus. Extraocular muscles intact.  HEENT: Head atraumatic, normocephalic.  Slight erythema back of the mouth.  Tonsils 3+.  Uvula swollen. NECK:  Supple, no jugular venous distention. No thyroid enlargement, no tenderness.  LUNGS: Normal breath sounds bilaterally, no wheezing, rales,rhonchi or crepitation. No use of accessory muscles of respiration.  CARDIOVASCULAR: S1, S2 normal. No murmurs, rubs, or gallops.  ABDOMEN: Soft, non-tender, non-distended. Bowel sounds present. No organomegaly or mass.  EXTREMITIES: No pedal edema, cyanosis, or clubbing.  NEUROLOGIC: Cranial nerves II through XII are intact. Muscle strength 5/5 in all extremities. Sensation intact. Gait not checked.  PSYCHIATRIC: The patient is alert and oriented x 3.  SKIN: No obvious rash, lesion, or ulcer.   DATA REVIEW:   CBC Recent Labs  Lab 04/27/18 0533  WBC 11.1*  HGB 11.3*  HCT 33.3*  PLT 165    Chemistries  Recent Labs  Lab 04/24/18 0919  04/27/18 0533  NA 139   < > 142  K 4.4   < > 3.6  CL 105   < > 108  CO2 28   < > 25  GLUCOSE 98   < > 171*  BUN 14   < > 17  CREATININE 0.80   < > 0.74  CALCIUM 9.5   < >  9.1  AST 16  --   --   ALT 16  --   --   ALKPHOS 83  --   --   BILITOT 0.8  --   --    < > = values in this interval not displayed.     Microbiology Results  Results for orders placed or performed during the hospital encounter of 04/26/18  Blood Culture (routine x 2)     Status: None (Preliminary result)   Collection Time: 04/26/18  3:42 PM  Result Value Ref Range Status   Specimen Description BLOOD BLOOD RIGHT FOREARM  Final   Special Requests   Final    BOTTLES DRAWN  AEROBIC AND ANAEROBIC Blood Culture adequate volume   Culture   Final    NO GROWTH 2 DAYS Performed at [facility], [address] [place], [place] [address]    Report Status PENDING  Incomplete  Blood Culture (routine x 2)     Status: None (Preliminary result)   Collection Time: 04/26/18  3:42 PM  Result Value Ref Range Status   Specimen Description BLOOD LEFT ANTECUBITAL  Final   Special Requests   Final    BOTTLES DRAWN AEROBIC AND ANAEROBIC Blood Culture adequate volume   Culture   Final    NO GROWTH 2 DAYS Performed at [facility], [address] [place], [place] [address]    Report Status PENDING  Incomplete  Group A Strep by PCR ([place] Only)     Status: None   Collection Time: 04/26/18  3:42 PM  Result Value Ref Range Status   Group A Strep by PCR NOT DETECTED NOT DETECTED Final    Comment: Performed at [facility], [address], [place], [place] [address]       Management plans discussed with the patient, and she is in agreement.  CODE STATUS:  Code Status History    Date Active Date Inactive Code Status Order ID Comments User Context   04/26/2018 1836 04/28/2018 1428 Full Code [id number]  Henreitta Leber, MD Inpatient    Advance Directive Documentation     Most Recent Value  Type of Advance Directive  Healthcare Power of Attorney  Pre-existing out of facility DNR order (yellow form or pink MOST form)  -  "MOST" Form in Place?  -      TOTAL TIME  TAKING CARE OF THIS PATIENT: 35 minutes.    Loletha Grayer M.D on 04/28/2018 at 2:51 PM  Between 7am to 6pm - Pager - [phone]  After 6pm go to [email or web address] - password [email or web address]  [employer] Office  [phone]  CC: Primary care physician; Crecencio Mc, MD

## 2018-04-30 ENCOUNTER — Telehealth: Payer: Self-pay

## 2018-04-30 ENCOUNTER — Telehealth: Payer: Self-pay | Admitting: Internal Medicine

## 2018-04-30 NOTE — Telephone Encounter (Signed)
Transition Care Management Follow-up Telephone Call   Date discharged?04/28/18   How have you been since you were released from the hospital? Better although my appetite has decreased. Taking tylenol. Have not checked temperature but felt warm earlier. Resting well.  Fatigued at the end of the day. Drinking plenty of water.   Do you understand why you were in the hospital? Yes, Sore throat after a virus.    Do you understand the discharge instructions? Follow up with otolaryngology. Follow up with pcp within 5 days. Drink plenty of fluids. Rest.    Where were you discharged to? Home.   Items Reviewed:  Medications reviewed: Yes, no taking all scheduled medications as directed.  Taking antibiotic without issues.   Allergies reviewed: Yes. Aspirin, dexilant, lubiprostone  Dietary changes reviewed: Soft diet  Referrals reviewed:Yes   Functional Questionnaire:  Activities of Daily Living (ADLs):   She states they are independent in the following: All ADLs States they require assistance with the following: No assistance requires at this time.   Any transportation issues/concerns?: No   Any patient concerns? None at this time.    Confirmed importance and date/time of follow-up visits scheduled 05/02/18 at 1130.   Provider Appointment booked with Dr. Derrel Nip (pcp).  Confirmed with patient if condition begins to worsen call PCP or go to the ER.  Patient was given the office number and encouraged to call back with question or concerns.  : Yes.

## 2018-05-01 LAB — CULTURE, BLOOD (ROUTINE X 2)
CULTURE: NO GROWTH
CULTURE: NO GROWTH
SPECIAL REQUESTS: ADEQUATE
Special Requests: ADEQUATE

## 2018-05-02 ENCOUNTER — Ambulatory Visit: Payer: Medicare HMO | Admitting: Internal Medicine

## 2018-05-02 ENCOUNTER — Encounter: Payer: Self-pay | Admitting: Internal Medicine

## 2018-05-02 DIAGNOSIS — J029 Acute pharyngitis, unspecified: Secondary | ICD-10-CM | POA: Diagnosis not present

## 2018-05-02 DIAGNOSIS — Z09 Encounter for follow-up examination after completed treatment for conditions other than malignant neoplasm: Secondary | ICD-10-CM

## 2018-05-02 DIAGNOSIS — R1310 Dysphagia, unspecified: Secondary | ICD-10-CM

## 2018-05-02 DIAGNOSIS — K122 Cellulitis and abscess of mouth: Secondary | ICD-10-CM

## 2018-05-02 MED ORDER — PREDNISONE 10 MG PO TABS: ORAL_TABLET | ORAL | 0 refills | Status: DC

## 2018-05-02 MED ORDER — OMEPRAZOLE 40 MG PO CPDR: 40.0000 mg | DELAYED_RELEASE_CAPSULE | Freq: Every day | ORAL | 0 refills | Status: DC

## 2018-05-02 NOTE — Progress Notes (Addendum)
Subjective:  Patient ID: Brandi Douglas, female    DOB: [birth year]  Age: 67 y.o. MRN: [id number]  CC: Diagnoses of Pharyngitis, unspecified etiology and Hospital discharge follow-up were pertinent to this visit.  HPI SHARNETTA GIELOW presents for hospital followup,  Admitted to [facility] on Oct 24 with dysphagia secondary to  pharyngitis/uvulitis after failing outpatient treatment  For presumed bacterial infection  With empiric antibiotic  (given azithromycin , no steroids on 10/22 visit)   In ED CT neck done to rule out abscess and critical airway compromise .  Mucosal thickening of posterior naso, oro and hypopharynges   Noted.   She was given IV antibitoics and  IV decadron for 2 days ,  kept for 2 days  And switched to levaquin and oral prednisone  And discharged on Oct 26   Currently endorses fatigue (chronic),  A persistent feeling of foreign body in throat,  Eating only soft foods  And noting an altered sense of taste.   No  regurgitation or choking , but tried to eat a BLT yesterday,  Had vomiting and abdominal cramping  yesterday    Outpatient Medications Prior to Visit  Medication Sig Dispense Refill  . ALPRAZolam (XANAX) 0.5 MG tablet Take 0.5-1 tablets (0.25-0.5 mg total) by mouth 2 (two) times daily as needed. For anxiety 30 tablet 5  . DULoxetine (CYMBALTA) 60 MG capsule Take 60 mg by mouth daily.     Marland Kitchen levofloxacin (LEVAQUIN) 500 MG tablet Take 1 tablet (500 mg total) by mouth daily. 7 tablet 0  . lisdexamfetamine (VYVANSE) 20 MG capsule Take 1 capsule by mouth daily.     . ondansetron (ZOFRAN) 8 MG tablet Take 1 tablet (8 mg total) by mouth every 8 (eight) hours as needed. For nausea 20 tablet 5  . predniSONE (DELTASONE) 10 MG tablet 4 tabs po day 1; 3 tabs po day2,3; 2 tabs po day 4,5; 1 tabs po day6,7;  1/2 tab po day8.9 17 tablet 0  . SUMAtriptan (IMITREX) 100 MG tablet TAKE 1 TABLET BY MOUTH AT ONSET OF HEADACHE. MAY TAKE AN ADDITIONAL TABLET IN 2 HOURS IF NEEDED. MAX 2/24hr. 10  tablet 3  . tolterodine (DETROL LA) 4 MG 24 hr capsule Take 4 mg by mouth daily.     No facility-administered medications prior to visit.     Review of Systems;  Patient denies headache, fevers, malaise, unintentional weight loss, skin rash, eye pain, sinus congestion and sinus pain, hemoptysis , cough, dyspnea, wheezing, chest pain, palpitations, orthopnea, edema, abdominal pain, nausea, melena, diarrhea, constipation, flank pain, dysuria, hematuria, urinary  Frequency, nocturia, numbness, tingling, seizures,  Focal weakness, Loss of consciousness,  Tremor, insomnia, depression, anxiety, and suicidal ideation.      Objective:  BP 120/74 (BP Location: Left Arm, Patient Position: Sitting, Cuff Size: Normal)   Pulse 98   Temp 98.4 F (36.9 C) (Oral)   Resp 15   Ht 5\' 6"  (1.676 m)   Wt 166 lb 3.2 oz (75.4 kg)   SpO2 98%   BMI 26.83 kg/m   BP Readings from Last 3 Encounters:  05/02/18 120/74  04/28/18 (!) 103/54  04/26/18 100/68    Wt Readings from Last 3 Encounters:  05/02/18 166 lb 3.2 oz (75.4 kg)  04/26/18 166 lb 10.7 oz (75.6 kg)  04/26/18 163 lb 9.6 oz (74.2 kg)    General appearance: alert, cooperative and appears stated age Ears: normal TM's and external ear canals both ears Throat: lips, mucosa,  and tongue normal; teeth and gums normal. UVula remains enlarged and elongated.  Neck: cervical  adenopathy, no carotid bruit, supple, symmetrical, trachea midline and thyroid not enlarged, symmetric, no tenderness/mass/nodules Back: symmetric, no curvature. ROM normal. No CVA tenderness. Lungs: clear to auscultation bilaterally Heart: regular rate and rhythm, S1, S2 normal, no murmur, click, rub or gallop Abdomen: soft, non-tender; bowel sounds normal; no masses,  no organomegaly Pulses: 2+ and symmetric Skin: Skin color, texture, turgor normal. No rashes or lesions Lymph nodes: Cervical, supraclavicular, and axillary nodes normal.  No results found for: HGBA1C  Lab  Results  Component Value Date   CREATININE 0.74 04/27/2018   CREATININE 0.78 04/26/2018   CREATININE 0.80 04/24/2018    Lab Results  Component Value Date   WBC 11.1 (H) 04/27/2018   HGB 11.3 (L) 04/27/2018   HCT 33.3 (L) 04/27/2018   PLT 165 04/27/2018   GLUCOSE 171 (H) 04/27/2018   CHOL 154 04/24/2018   TRIG 60.0 04/24/2018   HDL 58.30 04/24/2018   LDLDIRECT 125.0 04/14/2017   LDLCALC 84 04/24/2018   ALT 16 04/24/2018   AST 16 04/24/2018   NA 142 04/27/2018   K 3.6 04/27/2018   CL 108 04/27/2018   CREATININE 0.74 04/27/2018   BUN 17 04/27/2018   CO2 25 04/27/2018   TSH 2.49 04/24/2018    Ct Soft Tissue Neck W Contrast  Result Date: 04/26/2018 CLINICAL DATA:  Sore throat worsening over the last 3 days. Unable to swallow. EXAM: CT NECK WITH CONTRAST TECHNIQUE: Multidetector CT imaging of the neck was performed using the standard protocol following the bolus administration of intravenous contrast. CONTRAST:  23mL OMNIPAQUE IOHEXOL 300 MG/ML  SOLN COMPARISON:  None. FINDINGS: Pharynx and larynx: There appears to be mucosal thickening of the posterior nasopharynx, oropharynx and hypopharynx consistent with pharyngitis. No sign of abscess. No sign of critical airway compromise. No evidence deep space abscess. Question tiny amount of retropharyngeal effusion. Salivary glands: Parotid and submandibular glands are normal. Thyroid: Normal Lymph nodes: Slightly prominent level 2 nodes consistent with reactive inflammation. No evidence of suppuration. Vascular: Normal Limited intracranial: Normal Visualized orbits: Normal Mastoids and visualized paranasal sinuses: Clear/normal Skeleton: Ordinary cervical spondylosis. Upper chest: Benign appearing scarring at both apices. Other: None IMPRESSION: Pharyngitis pattern with mucosal prominence but no evidence of advanced disease or drainable abscess. There may be a very small retropharyngeal effusion but no evidence of loculated collection or  abscess. Mild reactive adenopathy of the level 2 nodes without suppuration. Electronically Signed   By: [place] Chimes M.D.   On: 04/26/2018 14:18    Assessment & Plan:   Problem List Items Addressed This Visit    Hospital discharge follow-up    hospital follow up.  I have reviewed the records from the hospital admission in detail with patient today.  Patient is stable post discharge and has no new issues or questions about discharge plans at the visit today for hospital follow up. All labs , imaging studies and progress notes from admission were reviewed with patient today        Pharyngitis    With uvulitis and dysphagia.  CT results reviewed with patient.  Given her persistent symptoms,  Will increase steroid dose to 60 mg daily x 3 and then taper.  Advised to see ENT early next week if symptoms do not resolve over weekend.           I am having Brandi Douglas start on predniSONE and omeprazole. I  am also having her maintain her tolterodine, lisdexamfetamine, SUMAtriptan, DULoxetine, ondansetron, ALPRAZolam, levofloxacin, and predniSONE.  Meds ordered this encounter  Medications  . predniSONE (DELTASONE) 10 MG tablet    Sig: 6 tablets daily for 3 days,  then reduce by 1 tablet daily until gone    Dispense:  33 tablet    Refill:  0  . omeprazole (PRILOSEC) 40 MG capsule    Sig: Take 1 capsule (40 mg total) by mouth daily.    Dispense:  30 capsule    Refill:  0    There are no discontinued medications.  Follow-up: No follow-ups on file.   Crecencio Mc, MD

## 2018-05-02 NOTE — Patient Instructions (Addendum)
Adding omeprazole 40 mg daily in the am 30 minutes prior to eating  I am Changing prednisone  to higher dose .  I want you to take 60 mg total each day for 3  days,  Then daily taper your dose by 10 mg   Schedule an appt with Dr Kathyrn Sheriff early next week   Try gargling  with light salt water if tolerated  ( 1 teaspoon in 8 ounces water)

## 2018-05-03 DIAGNOSIS — Z09 Encounter for follow-up examination after completed treatment for conditions other than malignant neoplasm: Secondary | ICD-10-CM | POA: Insufficient documentation

## 2018-05-03 NOTE — Assessment & Plan Note (Signed)
hospital follow up.  I have reviewed the records from the hospital admission in detail with patient today.  Patient is stable post discharge and has no new issues or questions about discharge plans at the visit today for hospital follow up. All labs , imaging studies and progress notes from admission were reviewed with patient today

## 2018-05-03 NOTE — Assessment & Plan Note (Signed)
With uvulitis and dysphagia.  CT results reviewed with patient.  Given her persistent symptoms,  Will increase steroid dose to 60 mg daily x 3 and then taper.  Advised to see ENT early next week if symptoms do not resolve over weekend.

## 2018-05-04 ENCOUNTER — Telehealth: Payer: Self-pay

## 2018-05-04 NOTE — Telephone Encounter (Signed)
EMMI Follow-up: Noted on the report that the patient couldn't be reached at the home number.  Talked with Brandi Douglas and her cell number is listed as primary in Epic and she would like for [place] to contact her on this number:  [phone].  Everything is going fine and no needs noted for today.

## 2018-05-09 ENCOUNTER — Telehealth: Payer: Self-pay

## 2018-05-09 ENCOUNTER — Other Ambulatory Visit: Payer: Self-pay | Admitting: Internal Medicine

## 2018-05-09 MED ORDER — FLUCONAZOLE 150 MG PO TABS: 150.0000 mg | ORAL_TABLET | Freq: Every day | ORAL | 0 refills | Status: DC

## 2018-05-09 NOTE — Telephone Encounter (Signed)
mychart message has been routed to you.

## 2018-05-09 NOTE — Telephone Encounter (Signed)
Copied from [place] [phone]. Topic: General - Other >> May 09, 2018 12:05 PM Keene Breath wrote: Reason for CRM: Patient called to request an answer from Dr. Derrel Nip regarding a yeast infection she has in her mouth.  Patient will be going out of town and wanted to know what medication she can take as soon as possible.  Please advise.  CB# [phone]   Please advise?

## 2018-05-30 ENCOUNTER — Other Ambulatory Visit: Payer: Self-pay | Admitting: Internal Medicine

## 2018-06-07 ENCOUNTER — Other Ambulatory Visit: Payer: Self-pay | Admitting: Internal Medicine

## 2018-06-07 MED ORDER — TOLTERODINE TARTRATE ER 4 MG PO CP24: 4.0000 mg | ORAL_CAPSULE | Freq: Every day | ORAL | 2 refills | Status: DC

## 2018-06-07 NOTE — Telephone Encounter (Signed)
Requested medication (s) are due for refill today: yes  Requested medication (s) are on the active medication list: yes    Last refill:   Future visit scheduled no  Notes to clinic:LAst refill by Historical provider  Pt states spoke with Dr. Derrel Nip in August regarding refilling this med. Stated she would refill.  LAst refill historical provider  Requested Prescriptions  Pending Prescriptions Disp Refills   tolterodine (DETROL LA) 4 MG 24 hr capsule      Sig: Take 1 capsule (4 mg total) by mouth daily.     Urology:  Bladder Agents Passed - 06/07/2018 12:06 PM      Passed - Valid encounter within last 12 months    Recent Outpatient Visits          1 month ago Pharyngitis, unspecified etiology   [place] Crecencio Mc, MD   1 month ago [place] Guse, Jacquelynn Cree, FNP   1 month ago Surveyor, quantity McLean-Scocuzza, Nino Glow, MD   1 month ago Visit for preventive health examination   [place] Crecencio Mc, MD   1 year ago Estrogen deficiency   [facility] [place] Crecencio Mc, MD

## 2018-06-07 NOTE — Telephone Encounter (Signed)
Copied from [place] [phone]. Topic: Quick Communication - Rx Refill/Question >> Jun 07, 2018 11:20 AM Keene Breath wrote: Medication: tolterodine (DETROL LA) 4 MG 24 hr capsule  Patient called to request a refill for the above medication  Preferred Pharmacy (with phone number or street name): [place] [place] [facility] [place]. [phone] (Phone) [phone] (Fax)

## 2018-06-29 ENCOUNTER — Other Ambulatory Visit: Payer: Self-pay | Admitting: Internal Medicine

## 2018-07-24 DIAGNOSIS — R69 Illness, unspecified: Secondary | ICD-10-CM | POA: Diagnosis not present

## 2018-07-24 DIAGNOSIS — R5382 Chronic fatigue, unspecified: Secondary | ICD-10-CM | POA: Diagnosis not present

## 2018-07-24 DIAGNOSIS — F330 Major depressive disorder, recurrent, mild: Secondary | ICD-10-CM | POA: Diagnosis not present

## 2018-07-31 DIAGNOSIS — Z1231 Encounter for screening mammogram for malignant neoplasm of breast: Secondary | ICD-10-CM | POA: Diagnosis not present

## 2018-07-31 LAB — HM MAMMOGRAPHY

## 2018-07-31 MED ORDER — OMEPRAZOLE 40 MG PO CPDR: 40.0000 mg | DELAYED_RELEASE_CAPSULE | Freq: Every day | ORAL | 1 refills | Status: DC

## 2018-08-07 MED ORDER — SUMATRIPTAN SUCCINATE 100 MG PO TABS: ORAL_TABLET | ORAL | 3 refills | Status: DC

## 2018-10-18 DIAGNOSIS — R5382 Chronic fatigue, unspecified: Secondary | ICD-10-CM | POA: Diagnosis not present

## 2018-10-18 DIAGNOSIS — F330 Major depressive disorder, recurrent, mild: Secondary | ICD-10-CM | POA: Diagnosis not present

## 2018-10-18 DIAGNOSIS — R69 Illness, unspecified: Secondary | ICD-10-CM | POA: Diagnosis not present

## 2018-11-01 ENCOUNTER — Other Ambulatory Visit: Payer: Self-pay | Admitting: Internal Medicine

## 2018-12-06 ENCOUNTER — Other Ambulatory Visit: Payer: Self-pay | Admitting: Internal Medicine

## 2019-01-08 DIAGNOSIS — R69 Illness, unspecified: Secondary | ICD-10-CM | POA: Diagnosis not present

## 2019-01-08 DIAGNOSIS — R5382 Chronic fatigue, unspecified: Secondary | ICD-10-CM | POA: Diagnosis not present

## 2019-01-08 DIAGNOSIS — F330 Major depressive disorder, recurrent, mild: Secondary | ICD-10-CM | POA: Diagnosis not present

## 2019-01-23 DIAGNOSIS — Z85828 Personal history of other malignant neoplasm of skin: Secondary | ICD-10-CM | POA: Diagnosis not present

## 2019-01-23 DIAGNOSIS — Z08 Encounter for follow-up examination after completed treatment for malignant neoplasm: Secondary | ICD-10-CM | POA: Diagnosis not present

## 2019-01-23 DIAGNOSIS — L821 Other seborrheic keratosis: Secondary | ICD-10-CM | POA: Diagnosis not present

## 2019-01-23 DIAGNOSIS — M10072 Idiopathic gout, left ankle and foot: Secondary | ICD-10-CM | POA: Diagnosis not present

## 2019-02-13 ENCOUNTER — Ambulatory Visit: Payer: Medicare HMO

## 2019-03-20 ENCOUNTER — Ambulatory Visit (INDEPENDENT_AMBULATORY_CARE_PROVIDER_SITE_OTHER): Payer: Medicare HMO

## 2019-03-20 ENCOUNTER — Other Ambulatory Visit: Payer: Self-pay

## 2019-03-20 DIAGNOSIS — Z0000 Encounter for general adult medical examination without abnormal findings: Secondary | ICD-10-CM

## 2019-03-20 NOTE — Progress Notes (Signed)
Subjective:   Brandi Douglas is a 68 y.o. female who presents for an Initial Medicare Annual Wellness Visit.  Review of Systems    No ROS.  Medicare Wellness Virtual Visit.  Visual/audio telehealth visit, UTA vital signs.   See social history for additional risk factors.    Cardiac Risk Factors include: advanced age (>67men, >65 women)     Objective:    Today's Vitals   There is no height or weight on file to calculate BMI.  Advanced Directives 03/20/2019 04/26/2018 04/26/2018  Does Patient Have a Medical Advance Directive? Yes Yes No  Type of Paramedic of [place];Living will [place] -  Does patient want to make changes to medical advance directive? No - Patient declined No - Patient declined -  Copy of [place] in Chart? No - copy requested No - copy requested -  Would patient like information on creating a medical advance directive? - No - Patient declined No - Patient declined    Current Medications (verified) Outpatient Encounter Medications as of 03/20/2019  Medication Sig  . ALPRAZolam (XANAX) 0.5 MG tablet Take 0.5-1 tablets (0.25-0.5 mg total) by mouth 2 (two) times daily as needed. For anxiety  . DULoxetine (CYMBALTA) 60 MG capsule Take 60 mg by mouth daily.   . fluconazole (DIFLUCAN) 150 MG tablet Take 1 tablet (150 mg total) by mouth daily.  Marland Kitchen levofloxacin (LEVAQUIN) 500 MG tablet Take 1 tablet (500 mg total) by mouth daily.  Marland Kitchen lisdexamfetamine (VYVANSE) 20 MG capsule Take 1 capsule by mouth daily.   Marland Kitchen omeprazole (PRILOSEC) 40 MG capsule TAKE 1 CAPSULE BY MOUTH ONCE DAILY  . ondansetron (ZOFRAN) 8 MG tablet Take 1 tablet (8 mg total) by mouth every 8 (eight) hours as needed. For nausea  . predniSONE (DELTASONE) 10 MG tablet 4 tabs po day 1; 3 tabs po day2,3; 2 tabs po day 4,5; 1 tabs po day6,7;  1/2 tab po day8.9  . predniSONE (DELTASONE) 10 MG tablet 6 tablets daily for 3 days,  then reduce by 1  tablet daily until gone  . SUMAtriptan (IMITREX) 100 MG tablet TAKE 1 TABLET BY MOUTH AT ONSET OF HEADACHE. MAY TAKE AN ADDITIONAL TABLET IN 2 HOURS IF NEEDED. MAX 2/24hr.  . tolterodine (DETROL LA) 4 MG 24 hr capsule TAKE 1 CAPSULE BY MOUTH ONCE DAILY   No facility-administered encounter medications on file as of 03/20/2019.     Allergies (verified) Aspirin, Dexilant [dexlansoprazole], and Lubiprostone   History: Past Medical History:  Diagnosis Date  . Chronic fatigue syndrome   . Generalized anxiety disorder   . Irritable bowel syndrome    Past Surgical History:  Procedure Laterality Date  . CHOLECYSTECTOMY     Family History  Problem Relation Age of Onset  . Hypothyroidism Mother   . Hypertension Father    Social History   Socioeconomic History  . Marital status: Married    Spouse name: Brandi Douglas   . Number of children: Not on file  . Years of education: Not on file  . Highest education level: Not on file  Occupational History  . 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 Financial resource strain: Not hard at all  . Food insecurity    Worry: Never true    Inability: Never true  . Transportation needs    Medical: No    Non-medical: No  Tobacco Use  . Smoking  status: Never Smoker  . Smokeless tobacco: Never Used  Substance and Sexual Activity  . Alcohol use: No  . Drug use: No  . Sexual activity: Yes    Partners: Male  Lifestyle  . Physical activity    Days per week: Not on file    Minutes per session: Not on file  . Stress: Not at all  Relationships  . Social Herbalist on phone: Not on file    Gets together: Not on file    Attends religious service: Not on file    Active member of club or organization: Not on file    Attends meetings of clubs or organizations: Not on file    Relationship status: Not on file  Other Topics Concern  . Not on file  Social History Narrative   Married     Tobacco Counseling  Counseling given: Not Answered   Clinical Intake:  Pre-visit preparation completed: Yes        Diabetes: No  How often do you need to have someone help you when you read instructions, pamphlets, or other written materials from your doctor or pharmacy?: 1 - Never  Interpreter Needed?: No      Activities of Daily Living In your present state of health, do you have any difficulty performing the following activities: 03/20/2019 04/28/2018  Hearing? N -  Vision? N -  Difficulty concentrating or making decisions? N -  Walking or climbing stairs? N -  Dressing or bathing? N -  Doing errands, shopping? N N  Preparing Food and eating ? N -  Using the Toilet? N -  In the past six months, have you accidently leaked urine? N -  Do you have problems with loss of bowel control? N -  Managing your Medications? N -  Managing your Finances? N -  Housekeeping or managing your Housekeeping? N -  Some recent data might be hidden     Immunizations and Health Maintenance Immunization History  Administered Date(s) Administered  . Influenza Split 03/13/2014  . Influenza,inj,Quad PF,6+ Mos 05/06/2015, 05/17/2016  . Influenza-Unspecified 03/04/2012, 05/03/2013, 04/19/2017, 04/19/2018  . PPD Test 02/05/2014  . Tdap 01/18/2013  . Zoster 04/04/2011   Health Maintenance Due  Topic Date Due  . DEXA SCAN  06/11/2017    Patient Care Team: Crecencio Mc, MD as PCP - General (Internal Medicine)  Indicate any recent Medical Services you may have received from other than [facility] providers in the past year (date may be approximate).     Assessment:   This is a routine wellness examination for [name].  I connected with patient 03/20/19 at 10:30 AM EDT by an audio enabled telemedicine application and verified that I am speaking with the correct person using two identifiers. Patient stated full name and DOB. Patient gave permission to continue with virtual visit. Patient's location was at home and  Nurse's location was at [place].   Health Maintenance Due: -Influenza vaccine 2020- discussed; to be completed in season with doctor or local pharmacy.   -PNA - discussed; to be completed with doctor in visit or local pharmacy.  -Dexa- due; last ordered 04/2018 Update all pending maintenance due as appropriate.   See completed HM at the end of note.   Eye: Visual acuity not assessed. Virtual visit. Wears corrective lenses. Followed by their ophthalmologist every 12 months.   Dental: Visits every 6 months.    Hearing: Demonstrates normal hearing during visit.  Safety:  Patient feels  safe at home- yes Patient does have smoke detectors at home- yes Patient does wear sunscreen or protective clothing when in direct sunlight - yes Patient does wear seat belt when in a moving vehicle - yes Patient drives- yes Adequate lighting in walkways free from debris- yes Grab bars and handrails used as appropriate- yes Ambulates with no assistive device Cell phone on person when ambulating outside of the home- yes  Social: Alcohol intake - no      Smoking history- never   Smokers in home? none Illicit drug use? none  Depression: PHQ 2 &9 complete. See screening below. Denies irritability, anhedonia, sadness/tearfullness.  Stable.   Falls: See screening below.    Medication: Taking as directed and without issues.   Covid-19: Precautions and sickness symptoms discussed. Wears mask, social distancing, hand hygiene as appropriate.   Activities of Daily Living Patient denies needing assistance with: household chores, feeding themselves, getting from bed to chair, getting to the toilet, bathing/showering, dressing, managing money, or preparing meals.   Memory: Patient is alert. Patient denies difficulty focusing or concentrating. Correctly identified the president of the [place], season and recall. Patient likes to read for brain stimulation.  BMI- discussed the importance of a  healthy diet, water intake and the benefits of aerobic exercise.  Educational material provided.  Physical activity- master gardner, raising a small farm of animals  Diet: Healthy/bland diet Water: good intake Caffeine: tea Boost/Protein supplement: yes  Advanced Directive: End of life planning; Advance aging; Advanced directives discussed.  Copy of current HCPOA/Living Will requested.    Other Providers Patient Care Team: Crecencio Mc, MD as PCP - General (Internal Medicine)  Hearing/Vision screen  Hearing Screening   125Hz  250Hz  500Hz  1000Hz  2000Hz  3000Hz  4000Hz  6000Hz  8000Hz   Right ear:           Left ear:           Comments: Patient is able to hear conversational tones without difficulty.  No issues reported.    Vision Screening Comments: Wears corrective lenses Visual acuity not assessed, virtual visit.  They have seen their ophthalmologist in the last 12 months.     Dietary issues and exercise activities discussed: Current Exercise Habits: Home exercise routine, Intensity: Mild  Goals      Patient Stated   . Increase physical activity (pt-stated)     Resume gym exercises      Depression Screen PHQ 2/9 Scores 03/20/2019 04/24/2018 04/17/2017  PHQ - 2 Score 0 0 2  PHQ- 9 Score - - 5    Fall Risk Fall Risk  03/20/2019 04/24/2018  Falls in the past year? 0 No   Timed Get Up and Go Performed :no, virtual visit  Cognitive Function:     6CIT Screen 03/20/2019  What Year? 0 points  What month? 0 points  What time? 0 points  Count back from 20 0 points  Months in reverse 0 points  Repeat phrase 0 points  Total Score 0    Screening Tests Health Maintenance  Topic Date Due  . DEXA SCAN  06/11/2017  . INFLUENZA VACCINE  10/02/2019 (Originally 02/02/2019)  . PNA vac Low Risk Adult (1 of 2 - PCV13) 03/19/2020 (Originally 06/11/2017)  . COLONOSCOPY  07/12/2019  . MAMMOGRAM  07/31/2020  . TETANUS/TDAP  01/19/2023  . Hepatitis C Screening  Completed      Plan:    Keep all routine maintenance appointments.   Medicare Attestation I have personally reviewed: The patient's medical and  social history Their use of alcohol, tobacco or illicit drugs Their current medications and supplements The patient's functional ability including ADLs,fall risks, home safety risks, cognitive, and hearing and visual impairment Diet and physical activities Evidence for depression   In addition, I have reviewed and discussed with patient certain preventive protocols, quality metrics, and best practice recommendations. A written personalized care plan for preventive services as well as general preventive health recommendations were provided to patient via mail.     Varney Biles, LPN   [id number]

## 2019-03-20 NOTE — Patient Instructions (Addendum)
  Brandi Douglas , Thank you for taking time to come for your Medicare Wellness Visit. I appreciate your ongoing commitment to your health goals. Please review the following plan we discussed and let me know if I can assist you in the future.   These are the goals we discussed: Goals      Patient Stated   . Increase physical activity (pt-stated)     Resume gym exercises       This is a list of the screening recommended for you and due dates:  Health Maintenance  Topic Date Due  . DEXA scan (bone density measurement)  06/11/2017  . Flu Shot  10/02/2019*  . Pneumonia vaccines (1 of 2 - PCV13) 03/19/2020*  . Colon Cancer Screening  07/12/2019  . Mammogram  07/31/2020  . Tetanus Vaccine  01/19/2023  .  Hepatitis C: One time screening is recommended by Center for Disease Control  (CDC) for  adults born from [birth year] through 1964.   Completed  *Topic was postponed. The date shown is not the original due date.

## 2019-04-09 DIAGNOSIS — F330 Major depressive disorder, recurrent, mild: Secondary | ICD-10-CM | POA: Diagnosis not present

## 2019-04-09 DIAGNOSIS — R5382 Chronic fatigue, unspecified: Secondary | ICD-10-CM | POA: Diagnosis not present

## 2019-04-09 DIAGNOSIS — R69 Illness, unspecified: Secondary | ICD-10-CM | POA: Diagnosis not present

## 2019-05-07 DIAGNOSIS — R69 Illness, unspecified: Secondary | ICD-10-CM | POA: Diagnosis not present

## 2019-05-08 ENCOUNTER — Other Ambulatory Visit: Payer: Self-pay

## 2019-05-08 MED ORDER — OMEPRAZOLE 40 MG PO CPDR: 40.0000 mg | DELAYED_RELEASE_CAPSULE | Freq: Every day | ORAL | 0 refills | Status: DC

## 2019-05-16 DIAGNOSIS — U071 COVID-19: Secondary | ICD-10-CM | POA: Diagnosis not present

## 2019-06-12 DIAGNOSIS — F330 Major depressive disorder, recurrent, mild: Secondary | ICD-10-CM | POA: Diagnosis not present

## 2019-06-12 DIAGNOSIS — R5382 Chronic fatigue, unspecified: Secondary | ICD-10-CM | POA: Diagnosis not present

## 2019-06-12 DIAGNOSIS — R69 Illness, unspecified: Secondary | ICD-10-CM | POA: Diagnosis not present

## 2019-06-18 DIAGNOSIS — R5382 Chronic fatigue, unspecified: Secondary | ICD-10-CM | POA: Diagnosis not present

## 2019-06-18 DIAGNOSIS — R69 Illness, unspecified: Secondary | ICD-10-CM | POA: Diagnosis not present

## 2019-06-18 DIAGNOSIS — F330 Major depressive disorder, recurrent, mild: Secondary | ICD-10-CM | POA: Diagnosis not present

## 2019-06-19 ENCOUNTER — Other Ambulatory Visit: Payer: Self-pay | Admitting: Internal Medicine

## 2019-07-30 DIAGNOSIS — R69 Illness, unspecified: Secondary | ICD-10-CM | POA: Diagnosis not present

## 2019-07-30 DIAGNOSIS — F330 Major depressive disorder, recurrent, mild: Secondary | ICD-10-CM | POA: Diagnosis not present

## 2019-07-30 DIAGNOSIS — R5382 Chronic fatigue, unspecified: Secondary | ICD-10-CM | POA: Diagnosis not present

## 2019-08-12 ENCOUNTER — Other Ambulatory Visit: Payer: Self-pay | Admitting: Family

## 2019-11-05 ENCOUNTER — Other Ambulatory Visit: Payer: Self-pay | Admitting: Family

## 2019-11-05 MED ORDER — OMEPRAZOLE 40 MG PO CPDR: 40.0000 mg | DELAYED_RELEASE_CAPSULE | Freq: Every day | ORAL | 0 refills | Status: DC

## 2019-12-11 ENCOUNTER — Encounter: Payer: Self-pay | Admitting: Internal Medicine

## 2019-12-11 ENCOUNTER — Ambulatory Visit (INDEPENDENT_AMBULATORY_CARE_PROVIDER_SITE_OTHER): Payer: Medicare HMO | Admitting: Internal Medicine

## 2019-12-11 ENCOUNTER — Other Ambulatory Visit: Payer: Self-pay

## 2019-12-11 VITALS — BP 114/62 | HR 59 | Temp 96.8°F | Resp 14 | Ht 66.0 in | Wt 152.6 lb

## 2019-12-11 DIAGNOSIS — K581 Irritable bowel syndrome with constipation: Secondary | ICD-10-CM | POA: Diagnosis not present

## 2019-12-11 DIAGNOSIS — R5382 Chronic fatigue, unspecified: Secondary | ICD-10-CM

## 2019-12-11 DIAGNOSIS — Z780 Asymptomatic menopausal state: Secondary | ICD-10-CM | POA: Diagnosis not present

## 2019-12-11 DIAGNOSIS — Z1231 Encounter for screening mammogram for malignant neoplasm of breast: Secondary | ICD-10-CM | POA: Diagnosis not present

## 2019-12-11 DIAGNOSIS — R5383 Other fatigue: Secondary | ICD-10-CM | POA: Diagnosis not present

## 2019-12-11 DIAGNOSIS — E782 Mixed hyperlipidemia: Secondary | ICD-10-CM | POA: Diagnosis not present

## 2019-12-11 DIAGNOSIS — E559 Vitamin D deficiency, unspecified: Secondary | ICD-10-CM | POA: Diagnosis not present

## 2019-12-11 DIAGNOSIS — Z0000 Encounter for general adult medical examination without abnormal findings: Secondary | ICD-10-CM | POA: Diagnosis not present

## 2019-12-11 DIAGNOSIS — F411 Generalized anxiety disorder: Secondary | ICD-10-CM

## 2019-12-11 DIAGNOSIS — G9332 Myalgic encephalomyelitis/chronic fatigue syndrome: Secondary | ICD-10-CM

## 2019-12-11 LAB — COMPREHENSIVE METABOLIC PANEL
ALT: 11 U/L (ref 0–35)
AST: 17 U/L (ref 0–37)
Albumin: 4.5 g/dL (ref 3.5–5.2)
Alkaline Phosphatase: 91 U/L (ref 39–117)
BUN: 17 mg/dL (ref 6–23)
CO2: 29 mEq/L (ref 19–32)
Calcium: 9.6 mg/dL (ref 8.4–10.5)
Chloride: 104 mEq/L (ref 96–112)
Creatinine, Ser: 0.78 mg/dL (ref 0.40–1.20)
GFR: 73.56 mL/min (ref >60.00)
Glucose, Bld: 96 mg/dL (ref 70–99)
Potassium: 4.3 mEq/L (ref 3.5–5.1)
Sodium: 138 mEq/L (ref 135–145)
Total Bilirubin: 0.6 mg/dL (ref 0.2–1.2)
Total Protein: 6.5 g/dL (ref 6.0–8.3)

## 2019-12-11 LAB — CBC WITH DIFFERENTIAL/PLATELET
Basophils Absolute: 0 10*3/uL (ref 0.0–0.1)
Basophils Relative: 0.5 % (ref 0.0–3.0)
Eosinophils Absolute: 0.1 10*3/uL (ref 0.0–0.7)
Eosinophils Relative: 1.4 % (ref 0.0–5.0)
HCT: 37.3 % (ref 36.0–46.0)
Hemoglobin: 12.8 g/dL (ref 12.0–15.0)
Lymphocytes Relative: 28.1 % (ref 12.0–46.0)
Lymphs Abs: 1.5 10*3/uL (ref 0.7–4.0)
MCHC: 34.4 g/dL (ref 30.0–36.0)
MCV: 92.5 fl (ref 78.0–100.0)
Monocytes Absolute: 0.4 10*3/uL (ref 0.1–1.0)
Monocytes Relative: 7.2 % (ref 3.0–12.0)
Neutro Abs: 3.3 10*3/uL (ref 1.4–7.7)
Neutrophils Relative %: 62.8 % (ref 43.0–77.0)
Platelets: 167 10*3/uL (ref 150.0–400.0)
RBC: 4.03 Mil/uL (ref 3.87–5.11)
RDW: 12.4 % (ref 11.5–15.5)
WBC: 5.2 10*3/uL (ref 4.0–10.5)

## 2019-12-11 LAB — LIPID PANEL
Cholesterol: 170 mg/dL (ref 0–200)
HDL: 64.5 mg/dL (ref >39.00)
LDL Cholesterol: 95 mg/dL (ref 0–99)
NonHDL: 105.73
Total CHOL/HDL Ratio: 3
Triglycerides: 56 mg/dL (ref 0.0–149.0)
VLDL: 11.2 mg/dL (ref 0.0–40.0)

## 2019-12-11 LAB — VITAMIN D 25 HYDROXY (VIT D DEFICIENCY, FRACTURES): VITD: 35.54 ng/mL (ref 30.00–100.00)

## 2019-12-11 LAB — TSH: TSH: 1.54 u[IU]/mL (ref 0.35–4.50)

## 2019-12-11 MED ORDER — DICYCLOMINE HCL 10 MG/5ML PO SOLN: 20.0000 mg | Freq: Three times a day (TID) | ORAL | 2 refills | Status: DC

## 2019-12-11 NOTE — Progress Notes (Signed)
Patient ID: Brandi Douglas, female    DOB: 11-07-1950  Age: 69 y.o. MRN: [id number]  The patient is here for annual preventive  examination and review and management of other chronic problems. Last seen Oct 2019   This visit occurred during the SARS-CoV-2 public health emergency.  Safety protocols were in place, including screening questions prior to the visit, additional usage of staff PPE, and extensive cleaning of exam room while observing appropriate contact time as indicated for disinfecting solutions.     The risk factors are reflected in the social history.  The roster of all physicians providing medical care to patient - is listed in the Snapshot section of the chart.  Activities of daily living:  The patient is 100% independent in all ADLs: dressing, toileting, feeding as well as independent mobility  Home safety : The patient has smoke detectors in the home. They wear seatbelts.  There are no firearms at home. There is no violence in the home.   There is no risks for hepatitis, STDs or HIV. There is no   history of blood transfusion. They have no travel history to infectious disease endemic areas of the world.  The patient has seen their dentist in the last six month. They have seen their eye doctor in the last year. They admit to slight hearing difficulty with regard to whispered voices and some television programs.  They have deferred audiologic testing in the last year.  They do not  have excessive sun exposure. Discussed the need for sun protection: hats, long sleeves and use of sunscreen if there is significant sun exposure.   Diet: the importance of a healthy diet is discussed. They do have a healthy diet.  The benefits of regular aerobic exercise were discussed. She walks 4 times per week ,  20 minutes.  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  Depression screen: there are no signs or vegative symptoms of depression-  irritability, change in appetite, anhedonia, sadness/tearfullness.  Cognitive assessment: the patient manages all their financial and personal affairs and is actively engaged. They could relate day,date,year and events; recalled 2/3 objects at 3 minutes; performed clock-face test normally.  The following portions of the patient's history were reviewed and updated as appropriate: allergies, current medications, past family history, past medical history,  past surgical history, past social history  and problem list.  Visual acuity was not assessed per patient preference since she has regular follow up with her ophthalmologist. Hearing and body mass index were assessed and reviewed.   During the course of the visit the patient was educated and counseled about appropriate screening and preventive services including : fall prevention , diabetes screening, nutrition counseling, colorectal cancer screening, and recommended immunizations.    CC: The primary encounter diagnosis was Postmenopausal estrogen deficiency. Diagnoses of Breast cancer screening by mammogram, Irritable bowel syndrome with constipation, Fatigue, unspecified type, Moderate mixed hyperlipidemia not requiring statin therapy, Vitamin D deficiency, Chronic fatigue syndrome, Generalized anxiety disorder, and Visit for preventive health examination were also pertinent to this visit.  Overweight  Resolved with wt loss:  .    Lost 7 lbs while taking care of mom during Dad's hospitalization , 14 lsince last visit  Has kept the weight off and by eating smaller portions ans decreased her  appetite.   GAD: Still seeing Miguel Dibble and Dr Nicolasa Ducking   Fatigue: till taking vyvanseand cymbalta prescribed by Dr. Nicolasa Ducking.  Her vyvanse dose  is  20  Mg daily , could use a lower dose but not available in 15 dose   IBS:  Still alternates between bloating/constipation and post prandial cramping/diarrhea .  History  Brandi Douglas has a past medical history of Chronic  fatigue syndrome, Generalized anxiety disorder, and Irritable bowel syndrome.   She has a past surgical history that includes Cholecystectomy.   Her family history includes Hypertension (age of onset: 82) in her father; Hypothyroidism in her mother; Testicular cancer (age of onset: 56) in her son.She reports that she has never smoked. She has never used smokeless tobacco. She reports that she does not drink alcohol and does not use drugs.  Outpatient Medications Prior to Visit  Medication Sig Dispense Refill  . ALPRAZolam (XANAX) 0.5 MG tablet Take 0.5-1 tablets (0.25-0.5 mg total) by mouth 2 (two) times daily as needed. For anxiety 30 tablet 5  . DULoxetine (CYMBALTA) 60 MG capsule Take 60 mg by mouth daily.     Marland Kitchen lisdexamfetamine (VYVANSE) 20 MG capsule Take 1 capsule by mouth daily.     Marland Kitchen omeprazole (PRILOSEC) 40 MG capsule Take 1 capsule (40 mg total) by mouth daily. 30 capsule 0  . ondansetron (ZOFRAN) 8 MG tablet Take 1 tablet (8 mg total) by mouth every 8 (eight) hours as needed. For nausea 20 tablet 5  . SUMAtriptan (IMITREX) 100 MG tablet TAKE 1 TABLET BY MOUTH AT ONSET OF HEADACHE. MAY TAKE AN ADDITIONAL TABLET IN 2 HOURS IF NEEDED. MAX 2/24hr. 10 tablet 3  . tolterodine (DETROL LA) 4 MG 24 hr capsule TAKE 1 CAPSULE BY MOUTH ONCE DAILY 90 capsule 1  . fluconazole (DIFLUCAN) 150 MG tablet Take 1 tablet (150 mg total) by mouth daily. (Patient not taking: Reported on 12/11/2019) 2 tablet 0  . levofloxacin (LEVAQUIN) 500 MG tablet Take 1 tablet (500 mg total) by mouth daily. 7 tablet 0  . predniSONE (DELTASONE) 10 MG tablet 4 tabs po day 1; 3 tabs po day2,3; 2 tabs po day 4,5; 1 tabs po day6,7;  1/2 tab po day8.9 (Patient not taking: Reported on 12/11/2019) 17 tablet 0  . predniSONE (DELTASONE) 10 MG tablet 6 tablets daily for 3 days,  then reduce by 1 tablet daily until gone (Patient not taking: Reported on 12/11/2019) 33 tablet 0   No facility-administered medications prior to visit.     Review of Systems   Patient denies headache, fevers, malaise, unintentional weight loss, skin rash, eye pain, sinus congestion and sinus pain, sore throat, dysphagia,  hemoptysis , cough, dyspnea, wheezing, chest pain, palpitations, orthopnea, edema, abdominal pain, nausea, melena, diarrhea, constipation, flank pain, dysuria, hematuria, urinary  Frequency, nocturia, numbness, tingling, seizures,  Focal weakness, Loss of consciousness,  Tremor, insomnia, depression, anxiety, and suicidal ideation.      Objective:  BP 114/62 (BP Location: Left Arm, Patient Position: Sitting, Cuff Size: Normal)   Pulse (!) 59   Temp (!) 96.8 F (36 C) (Temporal)   Resp 14   Ht 5\' 6"  (1.676 m)   Wt 152 lb 9.6 oz (69.2 kg)   SpO2 99%   BMI 24.63 kg/m   Physical Exam  General appearance: alert, cooperative and appears stated age Head: Normocephalic, without obvious abnormality, atraumatic Eyes: conjunctivae/corneas clear. PERRL, EOM's intact. Fundi benign. Ears: normal TM's and external ear canals both ears Nose: Nares normal. Septum midline. Mucosa normal. No drainage or sinus tenderness. Throat: lips, mucosa, and tongue normal; teeth and gums normal Neck: no adenopathy, no carotid bruit, no  JVD, supple, symmetrical, trachea midline and thyroid not enlarged, symmetric, no tenderness/mass/nodules Lungs: clear to auscultation bilaterally Breasts: normal appearance, no masses or tenderness Heart: regular rate and rhythm, S1, S2 normal, no murmur, click, rub or gallop Abdomen: soft, non-tender; bowel sounds normal; no masses,  no organomegaly Extremities: extremities normal, atraumatic, no cyanosis or edema Pulses: 2+ and symmetric Skin: Skin color, texture, turgor normal. No rashes or lesions Neurologic: Alert and oriented X 3, normal strength and tone. Normal symmetric reflexes. Normal coordination and gait.    Assessment & Plan:   Problem List Items Addressed This Visit      Unprioritized    Chronic fatigue syndrome    Continue Vyvanse and exercise.       Generalized anxiety disorder    Managed by Dr Nicolasa Ducking.  Paxil has been stopped and patient now taking cymbalta.  Improved insomnia noted by patient.       Irritable bowel syndrome (IBS)    Now constipation predominant with nausea occurring with BMs. Refilling bentyl and Levsin        Relevant Medications   dicyclomine (BENTYL) 10 MG/5ML solution   Visit for preventive health examination    age appropriate education and counseling updated, referrals for preventative services and immunizations addressed, dietary and smoking counseling addressed, most recent labs reviewed.  I have personally reviewed and have noted:  1) the patient's medical and social history 2) The pt's use of alcohol, tobacco, and illicit drugs 3) The patient's current medications and supplements 4) Functional ability including ADL's, fall risk, home safety risk, hearing and visual impairment 5) Diet and physical activities 6) Evidence for depression or mood disorder 7) The patient's height, weight, and BMI have been recorded in the chart  I have made referrals, and provided counseling and education based on review of the above       Other Visit Diagnoses    Postmenopausal estrogen deficiency    -  Primary   Relevant Orders   DG Bone Density   Breast cancer screening by mammogram       Relevant Orders   MM DIGITAL SCREENING BILATERAL   Fatigue, unspecified type       Relevant Orders   Comprehensive metabolic panel (Completed)   TSH (Completed)   CBC with Differential/Platelet (Completed)   Moderate mixed hyperlipidemia not requiring statin therapy       Relevant Orders   Lipid panel (Completed)   Vitamin D deficiency       Relevant Orders   VITAMIN D 25 Hydroxy (Vit-D Deficiency, Fractures) (Completed)      I have discontinued Maryann Alar levofloxacin, predniSONE, predniSONE, and fluconazole. I am also having her start on  dicyclomine. Additionally, I am having her maintain her lisdexamfetamine, DULoxetine, ondansetron, ALPRAZolam, and omeprazole.  Meds ordered this encounter  Medications  . dicyclomine (BENTYL) 10 MG/5ML solution    Sig: Take 10 mLs (20 mg total) by mouth 4 (four) times daily -  before meals and at bedtime.    Dispense:  240 mL    Refill:  2    Medications Discontinued During This Encounter  Medication Reason  . fluconazole (DIFLUCAN) 150 MG tablet Completed Course  . levofloxacin (LEVAQUIN) 500 MG tablet   . predniSONE (DELTASONE) 10 MG tablet Completed Course  . predniSONE (DELTASONE) 10 MG tablet Completed Course    Follow-up: No follow-ups on file.   Crecencio Mc, MD

## 2019-12-11 NOTE — Patient Instructions (Signed)
Try Premier Protein as an alternative  To skipping breakfast  Available in all groery stores and [employer]  Your DEXA and mammogram have been ordered to be done at [facility] in [facility] Maintenance After Age 68 After age 36, you are at a higher risk for certain long-term diseases and infections as well as injuries from falls. Falls are a major cause of broken bones and head injuries in people who are older than age 31. Getting regular preventive care can help to keep you healthy and well. Preventive care includes getting regular testing and making lifestyle changes as recommended by your health care provider. Talk with your health care provider about:  Which screenings and tests you should have. A screening is a test that checks for a disease when you have no symptoms.  A diet and exercise plan that is right for you. What should I know about screenings and tests to prevent falls? Screening and testing are the best ways to find a health problem early. Early diagnosis and treatment give you the best chance of managing medical conditions that are common after age 55. Certain conditions and lifestyle choices may make you more likely to have a fall. Your health care provider may recommend:  Regular vision checks. Poor vision and conditions such as cataracts can make you more likely to have a fall. If you wear glasses, make sure to get your prescription updated if your vision changes.  Medicine review. Work with your health care provider to regularly review all of the medicines you are taking, including over-the-counter medicines. Ask your health care provider about any side effects that may make you more likely to have a fall. Tell your health care provider if any medicines that you take make you feel dizzy or sleepy.  Osteoporosis screening. Osteoporosis is a condition that causes the bones to get weaker. This can make the bones weak and cause them to break more easily.  Blood pressure  screening. Blood pressure changes and medicines to control blood pressure can make you feel dizzy.  Strength and balance checks. Your health care provider may recommend certain tests to check your strength and balance while standing, walking, or changing positions.  Foot health exam. Foot pain and numbness, as well as not wearing proper footwear, can make you more likely to have a fall.  Depression screening. You may be more likely to have a fall if you have a fear of falling, feel emotionally low, or feel unable to do activities that you used to do.  Alcohol use screening. Using too much alcohol can affect your balance and may make you more likely to have a fall. What actions can I take to lower my risk of falls? General instructions  Talk with your health care provider about your risks for falling. Tell your health care provider if: ? You fall. Be sure to tell your health care provider about all falls, even ones that seem minor. ? You feel dizzy, sleepy, or off-balance.  Take over-the-counter and prescription medicines only as told by your health care provider. These include any supplements.  Eat a healthy diet and maintain a healthy weight. A healthy diet includes low-fat dairy products, low-fat (lean) meats, and fiber from whole grains, beans, and lots of fruits and vegetables. Home safety  Remove any tripping hazards, such as rugs, cords, and clutter.  Install safety equipment such as grab bars in bathrooms and safety rails on stairs.  Keep rooms and walkways well-lit. Activity  Follow a regular exercise program to stay fit. This will help you maintain your balance. Ask your health care provider what types of exercise are appropriate for you.  If you need a cane or [place], use it as recommended by your health care provider.  Wear supportive shoes that have nonskid soles. Lifestyle  Do not drink alcohol if your health care provider tells you not to drink.  If you drink  alcohol, limit how much you have: ? 0-1 drink a day for women. ? 0-2 drinks a day for men.  Be aware of how much alcohol is in your drink. In the [place] one drink equals one typical bottle of beer (12 oz), one-half glass of wine (5 oz), or one shot of hard liquor (1 oz).  Do not use any products that contain nicotine or tobacco, such as cigarettes and e-cigarettes. If you need help quitting, ask your health care provider. Summary  Having a healthy lifestyle and getting preventive care can help to protect your health and wellness after age 88.  Screening and testing are the best way to find a health problem early and help you avoid having a fall. Early diagnosis and treatment give you the best chance for managing medical conditions that are more common for people who are older than age 70.  Falls are a major cause of broken bones and head injuries in people who are older than age 46. Take precautions to prevent a fall at home.  Work with your health care provider to learn what changes you can make to improve your health and wellness and to prevent falls. This information is not intended to replace advice given to you by your health care provider. Make sure you discuss any questions you have with your health care provider. Document Revised: 10/11/2018 Document Reviewed: 05/03/2017 Elsevier Patient Education  2020 [employer].

## 2019-12-12 ENCOUNTER — Other Ambulatory Visit: Payer: Self-pay | Admitting: Internal Medicine

## 2019-12-13 ENCOUNTER — Other Ambulatory Visit: Payer: Self-pay | Admitting: Internal Medicine

## 2019-12-13 ENCOUNTER — Encounter: Payer: Self-pay | Admitting: Internal Medicine

## 2019-12-13 NOTE — Assessment & Plan Note (Signed)

## 2019-12-13 NOTE — Assessment & Plan Note (Signed)
Now constipation predominant with nausea occurring with BMs. Refilling bentyl and Levsin

## 2019-12-13 NOTE — Assessment & Plan Note (Signed)
Managed by Dr Nicolasa Ducking.  Paxil has been stopped and patient now taking cymbalta.  Improved insomnia noted by patient.

## 2019-12-13 NOTE — Assessment & Plan Note (Signed)
Continue Vyvanse and exercise.

## 2019-12-16 ENCOUNTER — Other Ambulatory Visit: Payer: Self-pay

## 2019-12-16 MED ORDER — OMEPRAZOLE 40 MG PO CPDR: 40.0000 mg | DELAYED_RELEASE_CAPSULE | Freq: Every day | ORAL | 0 refills | Status: DC

## 2020-01-21 MED ORDER — ONDANSETRON HCL 8 MG PO TABS: 8.0000 mg | ORAL_TABLET | Freq: Three times a day (TID) | ORAL | 5 refills | Status: DC | PRN

## 2020-03-13 LAB — HM DEXA SCAN: HM Dexa Scan: ABNORMAL

## 2020-03-17 ENCOUNTER — Encounter: Payer: Self-pay | Admitting: Internal Medicine

## 2020-03-17 ENCOUNTER — Other Ambulatory Visit: Payer: Self-pay | Admitting: Internal Medicine

## 2020-03-18 ENCOUNTER — Other Ambulatory Visit: Payer: Self-pay

## 2020-03-18 MED ORDER — OMEPRAZOLE 40 MG PO CPDR: 40.0000 mg | DELAYED_RELEASE_CAPSULE | Freq: Every day | ORAL | 0 refills | Status: DC

## 2020-03-20 ENCOUNTER — Ambulatory Visit: Payer: Medicare HMO

## 2020-03-20 LAB — HM MAMMOGRAPHY

## 2020-04-11 ENCOUNTER — Other Ambulatory Visit: Payer: Self-pay | Admitting: Internal Medicine

## 2020-04-11 MED ORDER — HYOSCYAMINE SULFATE 0.125 MG SL SUBL: 0.1250 mg | SUBLINGUAL_TABLET | SUBLINGUAL | 3 refills | Status: AC | PRN

## 2020-04-13 ENCOUNTER — Telehealth: Payer: Self-pay | Admitting: Internal Medicine

## 2020-04-13 DIAGNOSIS — M85852 Other specified disorders of bone density and structure, left thigh: Secondary | ICD-10-CM

## 2020-04-13 DIAGNOSIS — M85859 Other specified disorders of bone density and structure, unspecified thigh: Secondary | ICD-10-CM | POA: Insufficient documentation

## 2020-04-13 NOTE — Telephone Encounter (Signed)
MyChart message sent :  You have mild osteopenia by your recent DEXA , meaning that you have had some bone loss,  but not enough to call it osteoporosis.  Nevertheless, your  risk of fracture in the next  10 yrs is about slightly elevated, so I recommend weight bearing exercise, a goal intake of 1200 mg calcium through diet /supplements ,  A minimum of  1000 units Vit D daily  And we will repeat your DEXA scan in 2 years . If there has been significant progression, we will discuss medical therapy   I recommend getting the majority of your calcium and Vitamin D  through dietary sources rather than supplements given the recent association of calcium supplements with increased coronary artery calcium scores   almond/coconut milk  and cashew milks  are great nondairy alternatives to dairy sources for calcium  and are  cholesterol free .  Regards,  Dr. Derrel Nip

## 2020-05-12 ENCOUNTER — Telehealth: Payer: Self-pay

## 2020-05-12 NOTE — Telephone Encounter (Signed)
PA for Levsin has been submitted on covermymeds.

## 2020-05-19 NOTE — Telephone Encounter (Signed)
PATIENT can either pay out of pocket or submit a formulary to see what alternatives  They cover

## 2020-05-19 NOTE — Telephone Encounter (Signed)
Pt called returning your call 

## 2020-05-19 NOTE — Telephone Encounter (Signed)
PA for Levsin has been denied by insurance because her plan does not cover this medication.

## 2020-05-19 NOTE — Telephone Encounter (Signed)
Spoke with pt and she stated that she has already picked up the rx.  

## 2020-05-19 NOTE — Telephone Encounter (Signed)
LMTCB

## 2020-06-15 ENCOUNTER — Ambulatory Visit: Payer: Medicare HMO

## 2020-06-15 ENCOUNTER — Telehealth: Payer: Self-pay

## 2020-06-15 NOTE — Telephone Encounter (Signed)
Unsuccessful attempt to reach patient for scheduled awv. No answer. Left message to call the office back and reschedule.  

## 2020-06-16 NOTE — Progress Notes (Signed)
Opened in error

## 2020-06-17 ENCOUNTER — Ambulatory Visit (INDEPENDENT_AMBULATORY_CARE_PROVIDER_SITE_OTHER): Payer: Medicare HMO

## 2020-06-17 VITALS — Ht 66.0 in | Wt 152.0 lb

## 2020-06-17 DIAGNOSIS — Z0000 Encounter for general adult medical examination without abnormal findings: Secondary | ICD-10-CM

## 2020-06-17 NOTE — Progress Notes (Addendum)
Subjective:   Brandi Douglas is a 69 y.o. female who presents for Medicare Annual (Subsequent) preventive examination.  Review of Systems    No ROS.  Medicare Wellness Virtual Visit.   Cardiac Risk Factors include: advanced age (>28men, >34 women)     Objective:    Today's Vitals   06/17/20 1335  Weight: 152 lb (68.9 kg)  Height: 5\' 6"  (1.676 m)   Body mass index is 24.53 kg/m.  Advanced Directives 06/17/2020 03/20/2019 04/26/2018 04/26/2018  Does Patient Have a Medical Advance Directive? Yes Yes Yes No  Type of Paramedic of [place];Living will [place];Living will [address] -  Does patient want to make changes to medical advance directive? No - Patient declined No - Patient declined No - Patient declined -  Copy of [place] in Chart? No - copy requested No - copy requested No - copy requested -  Would patient like information on creating a medical advance directive? - - No - Patient declined No - Patient declined    Current Medications (verified) Outpatient Encounter Medications as of 06/17/2020  Medication Sig   ALPRAZolam (XANAX) 0.5 MG tablet Take 0.5-1 tablets (0.25-0.5 mg total) by mouth 2 (two) times daily as needed. For anxiety   dicyclomine (BENTYL) 10 MG/5ML solution Take 10 mLs (20 mg total) by mouth 4 (four) times daily -  before meals and at bedtime.   DULoxetine (CYMBALTA) 60 MG capsule Take 60 mg by mouth daily.    hyoscyamine (LEVSIN SL) 0.125 MG SL tablet Place 1 tablet (0.125 mg total) under the tongue every 4 (four) hours as needed for cramping.   lisdexamfetamine (VYVANSE) 20 MG capsule Take 1 capsule by mouth daily.    omeprazole (PRILOSEC) 40 MG capsule Take 1 capsule (40 mg total) by mouth daily.   ondansetron (ZOFRAN) 8 MG tablet Take 1 tablet (8 mg total) by mouth every 8 (eight) hours as needed. For nausea   SUMAtriptan (IMITREX) 100 MG tablet TAKE 1 TABLET BY MOUTH AT  ONSET OF HEAD-ACHE. MAY TAKE AN ADDITIONAL TABLET IN 2 HOURS IF NEEDED. MAX 2/24.   tolterodine (DETROL LA) 4 MG 24 hr capsule TAKE 1 CAPSULE BY MOUTH ONCE DAILY   No facility-administered encounter medications on file as of 06/17/2020.    Allergies (verified) Aspirin, Dexilant [dexlansoprazole], and Lubiprostone   History: Past Medical History:  Diagnosis Date   Chronic fatigue syndrome    Generalized anxiety disorder    Irritable bowel syndrome    Past Surgical History:  Procedure Laterality Date   CHOLECYSTECTOMY     Family History  Problem Relation Age of Onset   Hypothyroidism Mother    Hypertension Father 62       died in March 11, 2023 of complications during pacemaker insertion    Testicular cancer Son 56   Social History   Socioeconomic History   Marital status: Married    Spouse name: Eddie Dibbles    Number of children: Not on file   Years of education: Not on file   Highest education level: Not on file  Occupational History   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 [employer]  Tobacco Use   Smoking status: Never Smoker   Smokeless tobacco: Never Used  Substance and Sexual Activity   Alcohol use: No   Drug use: No   Sexual activity: Yes    Partners: Male  Other Topics Concern   Not on file  Social History Narrative   Married    Investment banker, operational of Radio broadcast assistant Strain: Not on file  Food Insecurity: Not on file  Transportation Needs: Not on file  Physical Activity: Not on file  Stress: Not on file  Social Connections: Not on file    Tobacco Counseling Counseling given: Not Answered   Clinical Intake:  Pre-visit preparation completed: Yes        Diabetes: No  How often do you need to have someone help you when you read instructions, pamphlets, or other written materials from your doctor or pharmacy?: 1 - Never   Interpreter Needed?: No      Activities of Daily Living In your present state of health, do you have any  difficulty performing the following activities: 06/17/2020  Hearing? N  Vision? N  Difficulty concentrating or making decisions? N  Walking or climbing stairs? N  Dressing or bathing? N  Doing errands, shopping? N  Preparing Food and eating ? N  Using the Toilet? N  In the past six months, have you accidently leaked urine? N  Do you have problems with loss of bowel control? N  Managing your Medications? N  Managing your Finances? N  Housekeeping or managing your Housekeeping? N  Some recent data might be hidden    Patient Care Team: Crecencio Mc, MD as PCP - General (Internal Medicine)  Indicate any recent Medical Services you may have received from other than [facility] providers in the past year (date may be approximate).     Assessment:   This is a routine wellness examination for Carrissa.  I connected with Enrica today by telephone and verified that I am speaking with the correct person using two identifiers. Location patient: home Location provider: work Persons participating in the virtual visit: patient, Marine scientist.    I discussed the limitations, risks, security and privacy concerns of performing an evaluation and management service by telephone and the availability of in person appointments. The patient expressed understanding and verbally consented to this telephonic visit.    Interactive audio and video telecommunications were attempted between this provider and patient, however failed, due to patient having technical difficulties OR patient did not have access to video capability.  We continued and completed visit with audio only.  Some vital signs may be absent or patient reported.   Hearing/Vision screen  Hearing Screening   125Hz  250Hz  500Hz  1000Hz  2000Hz  3000Hz  4000Hz  6000Hz  8000Hz   Right ear:           Left ear:           Comments: Patient is able to hear conversational tones without difficulty. No issues reported.  Vision Screening Comments: Wears corrective lenses   Visual acuity not assessed, virtual visit. They have seen their ophthalmologist in the last 12 months.   Dietary issues and exercise activities discussed:    Healthy diet  Good water intake  Goals       Patient Stated     Increase physical activity (pt-stated)      Resume gym exercises       Depression Screen PHQ 2/9 Scores 06/17/2020 03/20/2019 04/24/2018 04/17/2017  PHQ - 2 Score 0 0 0 2  PHQ- 9 Score - - - 5    Fall Risk Fall Risk  06/17/2020 12/11/2019 03/20/2019 04/24/2018  Falls in the past year? 0 0 0 No  Number falls in past yr: 0 - - -  Follow up Falls evaluation completed Falls evaluation completed - -  FALL RISK PREVENTION PERTAINING TO THE HOME: Handrails in use when climbing stairs? Yes Home free of loose throw rugs in walkways, pet beds, electrical cords, etc? Yes  Adequate lighting in your home to reduce risk of falls? Yes   ASSISTIVE DEVICES UTILIZED TO PREVENT FALLS: Use of a cane, Terrio or w/c? No   TIMED UP AND GO: Was the test performed? No . Virtual visit.   Cognitive Function:     6CIT Screen 03/20/2019  What Year? 0 points  What month? 0 points  What time? 0 points  Count back from 20 0 points  Months in reverse 0 points  Repeat phrase 0 points  Total Score 0    Immunizations Immunization History  Administered Date(s) Administered   Influenza Split 03/13/2014   Influenza, High Dose Seasonal PF 05/07/2019   Influenza,inj,Quad PF,6+ Mos 05/06/2015, 05/17/2016   Influenza-Unspecified 03/04/2012, 05/03/2013, 04/19/2017, 04/19/2018   Moderna Sars-Covid-2 Vaccination 07/25/2019, 08/28/2019   PPD Test 02/05/2014   Tdap 01/18/2013   Zoster 04/04/2011   Influenza vaccine- plans to receive later in the season. Deferred.   Pneumococcal vaccine- plans to schedule later in the season. Deferred. Advised may receive this vaccine in office, at local pharmacy or Health Dept. Aware to provide a copy of the vaccination record if obtained from  local pharmacy or Health Dept. Verbalized acceptance and understanding.  Health Maintenance Health Maintenance  Topic Date Due   COVID-19 Vaccine (3 - Booster for Moderna series) 02/25/2020   INFLUENZA VACCINE  10/01/2020 (Originally 02/02/2020)   PNA vac Low Risk Adult (1 of 2 - PCV13) 06/17/2021 (Originally 06/11/2017)   MAMMOGRAM  03/20/2022   TETANUS/TDAP  01/19/2023   COLONOSCOPY  03/19/2024   DEXA SCAN  Completed   Hepatitis C Screening  Completed   Colorectal cancer screening: Type of screening: Colonoscopy. Completed 07/11/2009. Repeat every 10 years.  Mammogram status: Completed 03/20/20. Repeat every year. HM.  Bone Density- 03/20/20. Osteopenia. Repeat every 2 years.   Covid Booster- plans to schedule at local pharmacy.   Lung Cancer Screening: (Low Dose CT Chest recommended if Age 45-80 years, 30 pack-year currently smoking OR have quit w/in 15years.) does not qualify.   Hepatitis C Screening: Completed 02/23/15.   Vision Screening: Recommended annual ophthalmology exams for early detection of glaucoma and other disorders of the eye. Is the patient up to date with their annual eye exam?  Yes  Who is the provider or what is the name of the office in which the patient attends annual eye exams? [place].  Dental Screening: Recommended annual dental exams for proper oral hygiene.   Community Resource Referral / Chronic Care Management: CRR required this visit?  No   CCM required this visit?  No      Plan:   Keep all routine maintenance appointments.   Follow up 12/16/20 @ 8:30.  I have personally reviewed and noted the following in the patient's chart:   Medical and social history Use of alcohol, tobacco or illicit drugs  Current medications and supplements Functional ability and status Nutritional status Physical activity Advanced directives List of other physicians Hospitalizations, surgeries, and ER visits in previous 12  months Vitals Screenings to include cognitive, depression, and falls Referrals and appointments  In addition, I have reviewed and discussed with patient certain preventive protocols, quality metrics, and best practice recommendations. A written personalized care plan for preventive services as well as general preventive health recommendations were provided to patient via mychart.  OBrien-Blaney, Taylen Wendland L, LPN   [id number]/84/[id number]     I have reviewed the above information and agree with above.   Deborra Medina, MD

## 2020-06-17 NOTE — Patient Instructions (Addendum)
Brandi Douglas , Thank you for taking time to come for your Medicare Wellness Visit. I appreciate your ongoing commitment to your health goals. Please review the following plan we discussed and let me know if I can assist you in the future.   These are the goals we discussed: Goals      Patient Stated   .  Increase physical activity (pt-stated)      Resume gym exercises       This is a list of the screening recommended for you and due dates:  Health Maintenance  Topic Date Due  . COVID-19 Vaccine (3 - Booster for Moderna series) 02/25/2020  . Flu Shot  10/01/2020*  . Pneumonia vaccines (1 of 2 - PCV13) 06/17/2021*  . Mammogram  03/20/2022  . Tetanus Vaccine  01/19/2023  . Colon Cancer Screening  03/19/2024  . DEXA scan (bone density measurement)  Completed  .  Hepatitis C: One time screening is recommended by Center for Disease Control  (CDC) for  adults born from [birth year] through 1964.   Completed  *Topic was postponed. The date shown is not the original due date.   Immunizations Immunization History  Administered Date(s) Administered  . Influenza Split 03/13/2014  . Influenza, High Dose Seasonal PF 05/07/2019  . Influenza,inj,Quad PF,6+ Mos 05/06/2015, 05/17/2016  . Influenza-Unspecified 03/04/2012, 05/03/2013, 04/19/2017, 04/19/2018  . Moderna Sars-Covid-2 Vaccination 07/25/2019, 08/28/2019  . PPD Test 02/05/2014  . Tdap 01/18/2013  . Zoster 04/04/2011   Keep all routine maintenance appointments.   Follow up 12/16/20 @ 8:30.  Advanced directives: End of life planning; Advance aging; Advanced directives discussed.  Copy of current HCPOA/Living Will requested.    Conditions/risks identified: None new.   Follow up in one year for your annual wellness visit.   Preventive Care 38 Years and Older, Female Preventive care refers to lifestyle choices and visits with your health care provider that can promote health and wellness. What does preventive care include?  A yearly  physical exam. This is also called an annual well check.  Dental exams once or twice a year.  Routine eye exams. Ask your health care provider how often you should have your eyes checked.  Personal lifestyle choices, including:  Daily care of your teeth and gums.  Regular physical activity.  Eating a healthy diet.  Avoiding tobacco and drug use.  Limiting alcohol use.  Practicing safe sex.  Taking low-dose aspirin every day.  Taking vitamin and mineral supplements as recommended by your health care provider. What happens during an annual well check? The services and screenings done by your health care provider during your annual well check will depend on your age, overall health, lifestyle risk factors, and family history of disease. Counseling  Your health care provider may ask you questions about your:  Alcohol use.  Tobacco use.  Drug use.  Emotional well-being.  Home and relationship well-being.  Sexual activity.  Eating habits.  History of falls.  Memory and ability to understand (cognition).  Work and work Statistician.  Reproductive health. Screening  You may have the following tests or measurements:  Height, weight, and BMI.  Blood pressure.  Lipid and cholesterol levels. These may be checked every 5 years, or more frequently if you are over 9 years old.  Skin check.  Lung cancer screening. You may have this screening every year starting at age 31 if you have a 30-pack-year history of smoking and currently smoke or have quit within the past 15 years.  Fecal occult blood test (FOBT) of the stool. You may have this test every year starting at age 64.  Flexible sigmoidoscopy or colonoscopy. You may have a sigmoidoscopy every 5 years or a colonoscopy every 10 years starting at age 64.  Hepatitis C blood test.  Hepatitis B blood test.  Sexually transmitted disease (STD) testing.  Diabetes screening. This is done by checking your blood sugar  (glucose) after you have not eaten for a while (fasting). You may have this done every 1-3 years.  Bone density scan. This is done to screen for osteoporosis. You may have this done starting at age 58.  Mammogram. This may be done every 1-2 years. Talk to your health care provider about how often you should have regular mammograms. Talk with your health care provider about your test results, treatment options, and if necessary, the need for more tests. Vaccines  Your health care provider may recommend certain vaccines, such as:  Influenza vaccine. This is recommended every year.  Tetanus, diphtheria, and acellular pertussis (Tdap, Td) vaccine. You may need a Td booster every 10 years.  Zoster vaccine. You may need this after age 59.  Pneumococcal 13-valent conjugate (PCV13) vaccine. One dose is recommended after age 36.  Pneumococcal polysaccharide (PPSV23) vaccine. One dose is recommended after age 74. Talk to your health care provider about which screenings and vaccines you need and how often you need them. This information is not intended to replace advice given to you by your health care provider. Make sure you discuss any questions you have with your health care provider. Document Released: 07/17/2015 Document Revised: 03/09/2016 Document Reviewed: 04/21/2015 Elsevier Interactive Patient Education  2017 [employer] Prevention in the Home Falls can cause injuries. They can happen to people of all ages. There are many things you can do to make your home safe and to help prevent falls. What can I do on the outside of my home?  Regularly fix the edges of walkways and driveways and fix any cracks.  Remove anything that might make you trip as you walk through a door, such as a raised step or threshold.  Trim any bushes or trees on the path to your home.  Use bright outdoor lighting.  Clear any walking paths of anything that might make someone trip, such as rocks or  tools.  Regularly check to see if handrails are loose or broken. Make sure that both sides of any steps have handrails.  Any raised decks and porches should have guardrails on the edges.  Have any leaves, snow, or ice cleared regularly.  Use sand or salt on walking paths during winter.  Clean up any spills in your garage right away. This includes oil or grease spills. What can I do in the bathroom?  Use night lights.  Install grab bars by the toilet and in the tub and shower. Do not use towel bars as grab bars.  Use non-skid mats or decals in the tub or shower.  If you need to sit down in the shower, use a plastic, non-slip stool.  Keep the floor dry. Clean up any water that spills on the floor as soon as it happens.  Remove soap buildup in the tub or shower regularly.  Attach bath mats securely with double-sided non-slip rug tape.  Do not have throw rugs and other things on the floor that can make you trip. What can I do in the bedroom?  Use night lights.  Make sure that  you have a light by your bed that is easy to reach.  Do not use any sheets or blankets that are too big for your bed. They should not hang down onto the floor.  Have a firm chair that has side arms. You can use this for support while you get dressed.  Do not have throw rugs and other things on the floor that can make you trip. What can I do in the kitchen?  Clean up any spills right away.  Avoid walking on wet floors.  Keep items that you use a lot in easy-to-reach places.  If you need to reach something above you, use a strong step stool that has a grab bar.  Keep electrical cords out of the way.  Do not use floor polish or wax that makes floors slippery. If you must use wax, use non-skid floor wax.  Do not have throw rugs and other things on the floor that can make you trip. What can I do with my stairs?  Do not leave any items on the stairs.  Make sure that there are handrails on both  sides of the stairs and use them. Fix handrails that are broken or loose. Make sure that handrails are as long as the stairways.  Check any carpeting to make sure that it is firmly attached to the stairs. Fix any carpet that is loose or worn.  Avoid having throw rugs at the top or bottom of the stairs. If you do have throw rugs, attach them to the floor with carpet tape.  Make sure that you have a light switch at the top of the stairs and the bottom of the stairs. If you do not have them, ask someone to add them for you. What else can I do to help prevent falls?  Wear shoes that:  Do not have high heels.  Have rubber bottoms.  Are comfortable and fit you well.  Are closed at the toe. Do not wear sandals.  If you use a stepladder:  Make sure that it is fully opened. Do not climb a closed stepladder.  Make sure that both sides of the stepladder are locked into place.  Ask someone to hold it for you, if possible.  Clearly mark and make sure that you can see:  Any grab bars or handrails.  First and last steps.  Where the edge of each step is.  Use tools that help you move around (mobility aids) if they are needed. These include:  Canes.  Walkers.  Scooters.  Crutches.  Turn on the lights when you go into a dark area. Replace any light bulbs as soon as they burn out.  Set up your furniture so you have a clear path. Avoid moving your furniture around.  If any of your floors are uneven, fix them.  If there are any pets around you, be aware of where they are.  Review your medicines with your doctor. Some medicines can make you feel dizzy. This can increase your chance of falling. Ask your doctor what other things that you can do to help prevent falls. This information is not intended to replace advice given to you by your health care provider. Make sure you discuss any questions you have with your health care provider. Document Released: 04/16/2009 Document Revised:  11/26/2015 Document Reviewed: 07/25/2014 Elsevier Interactive Patient Education  2017 [employer].

## 2020-09-03 ENCOUNTER — Other Ambulatory Visit: Payer: Self-pay | Admitting: Internal Medicine

## 2020-10-13 ENCOUNTER — Other Ambulatory Visit: Payer: Self-pay | Admitting: Internal Medicine

## 2020-10-16 IMAGING — CT CT NECK W/ CM
4 of 5 series · 14 of 33 positions shown, 16 images · IV contrast (omnipaque)
Comparison: None.

CLINICAL DATA: Sore throat worsening over the last 3 days. Unable
to swallow.

EXAM:
CT NECK WITH CONTRAST
TECHNIQUE: Multidetector CT imaging of the neck was performed using the
standard protocol following the bolus administration of intravenous
contrast.
CONTRAST:  75mL OMNIPAQUE IOHEXOL 300 MG/ML  SOLN

[Series 2: axial neck · axial · 0.57mm/px · z∈[-258,-132]mm · 4 of 105 slices shown, 5 images]
[im 21/105  soft-tissue]
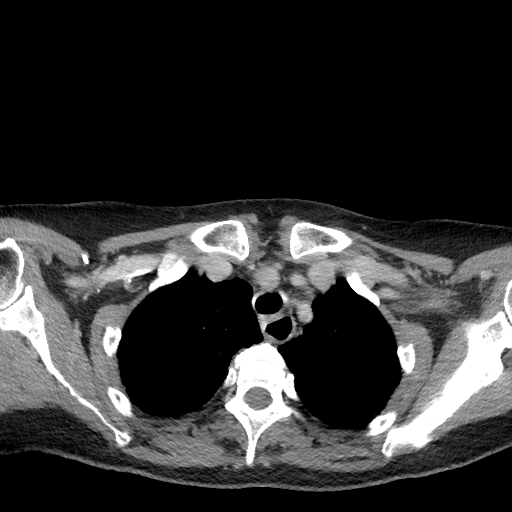
[im 21/105  bone]
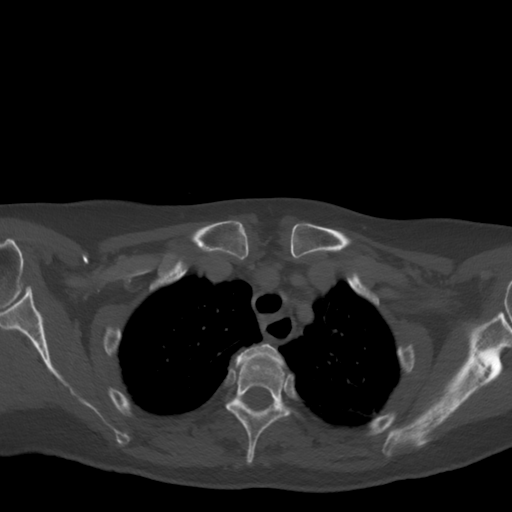
[im 42/105  bone]
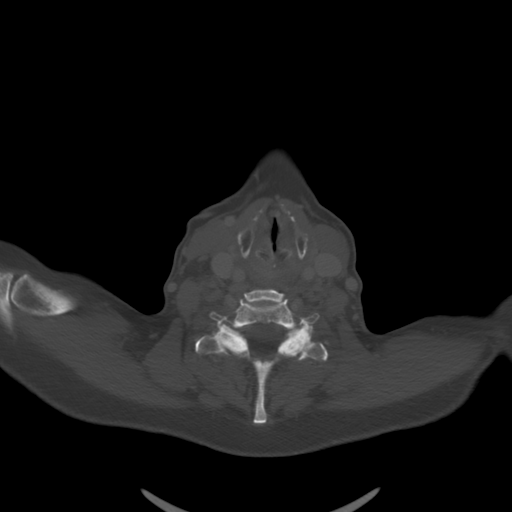
[im 63/105  bone]
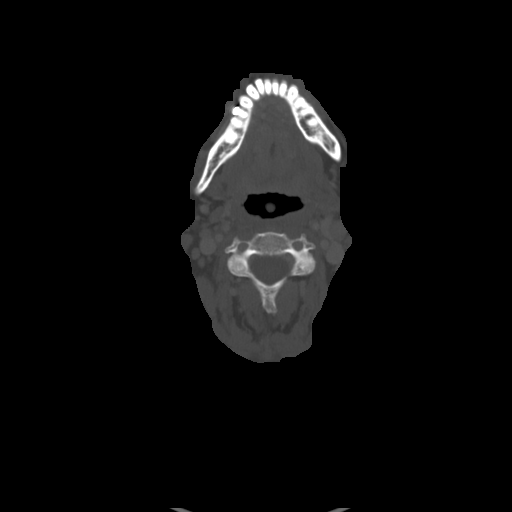
[im 84/105  bone]
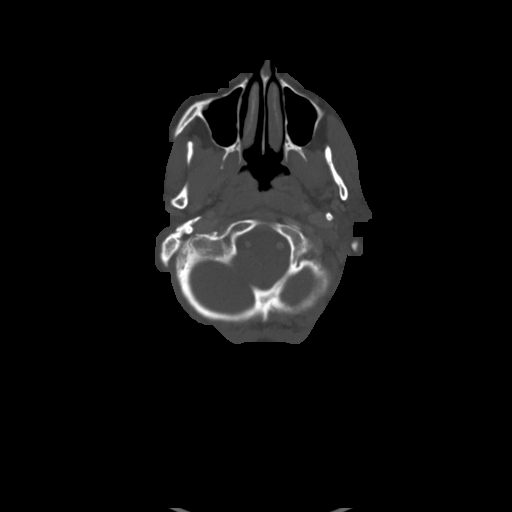

[Series 6: sag neck · sagittal · 0.44mm/px · 5 of 98 slices shown, 6 images]
[im 33/98  bone]
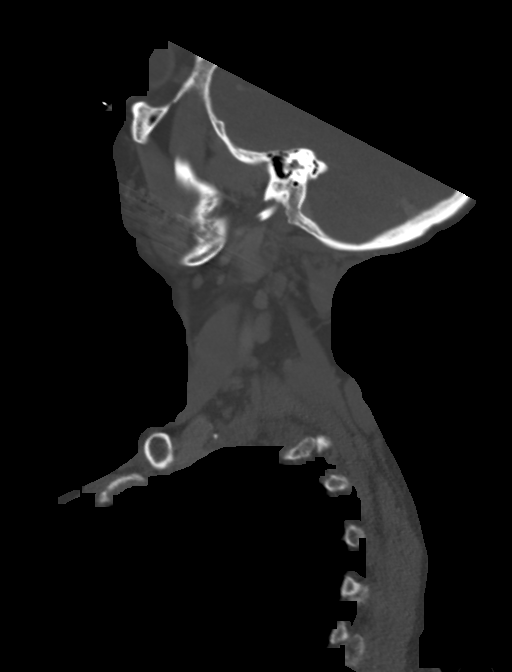
[im 41/98  bone]
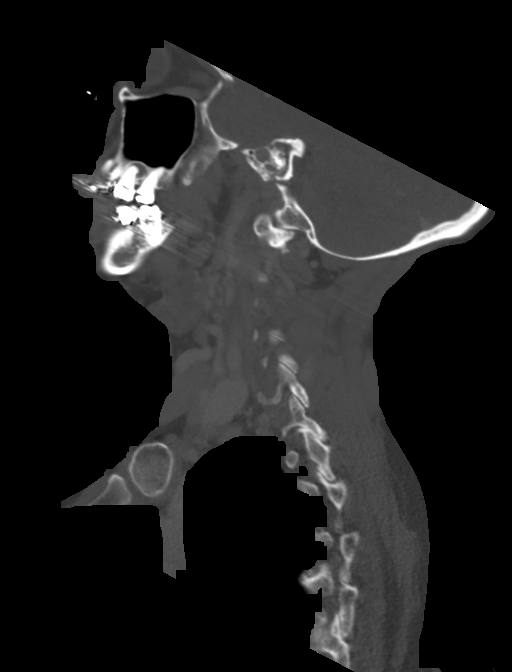
[im 49/98  soft-tissue]
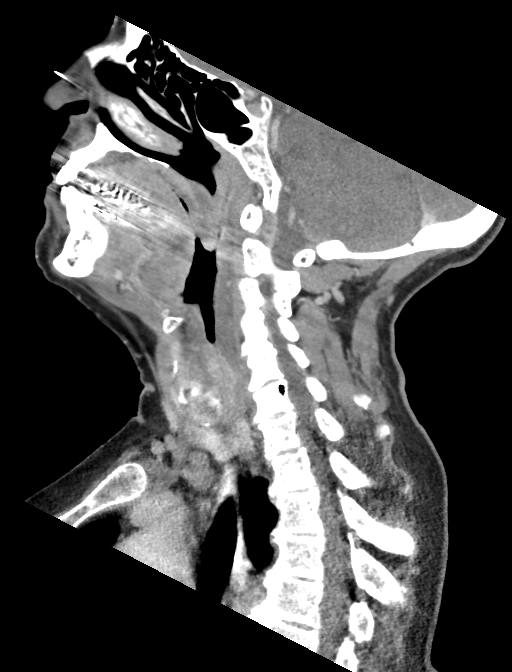
[im 49/98  bone]
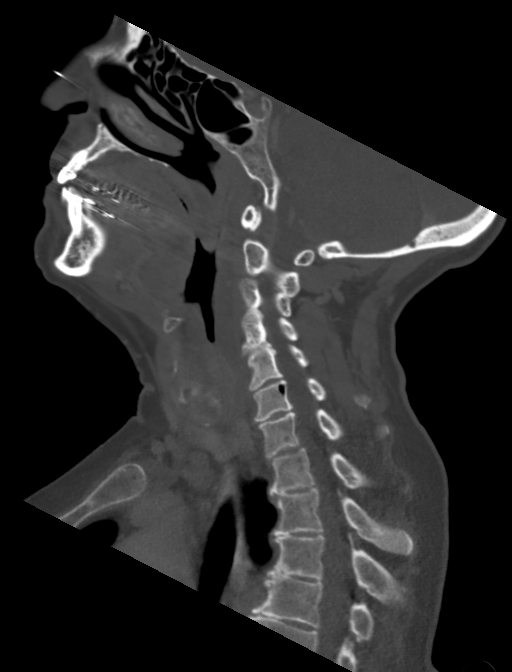
[im 57/98  bone]
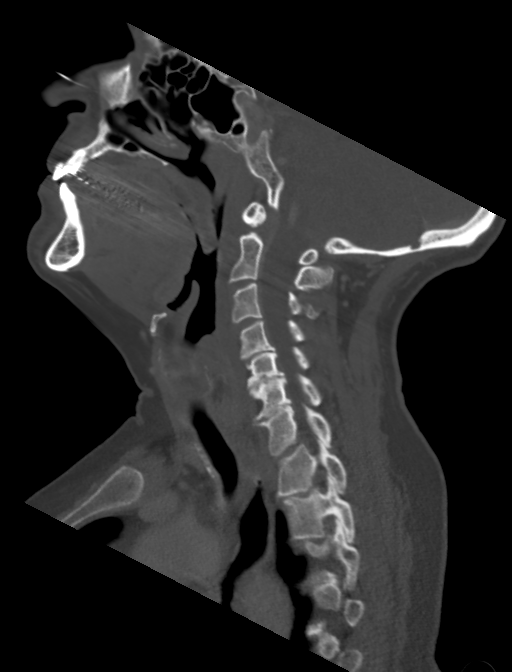
[im 65/98  bone]
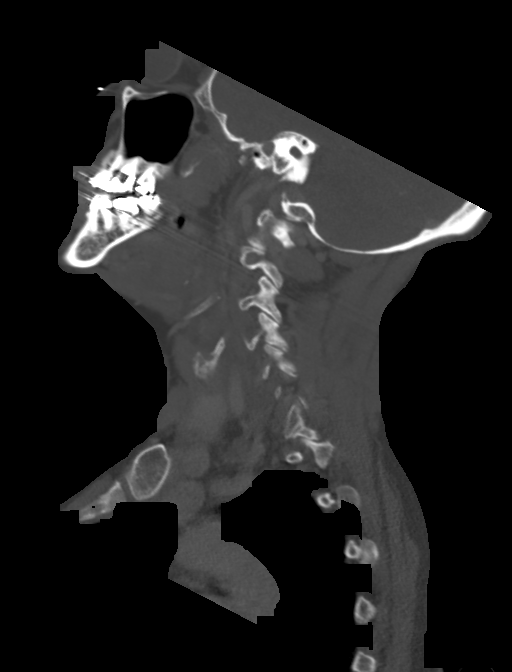

[Series 7: cor neck · coronal · 0.39mm/px · 3 of 77 slices shown]
[im 22/77  bone]
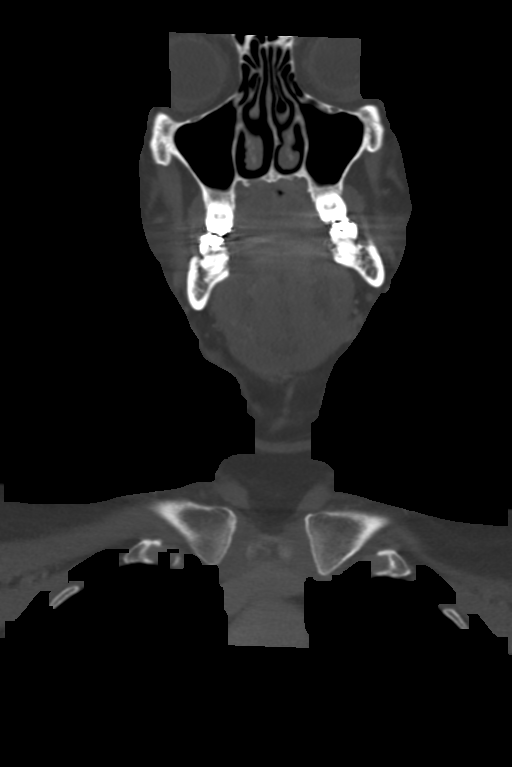
[im 33/77  bone]
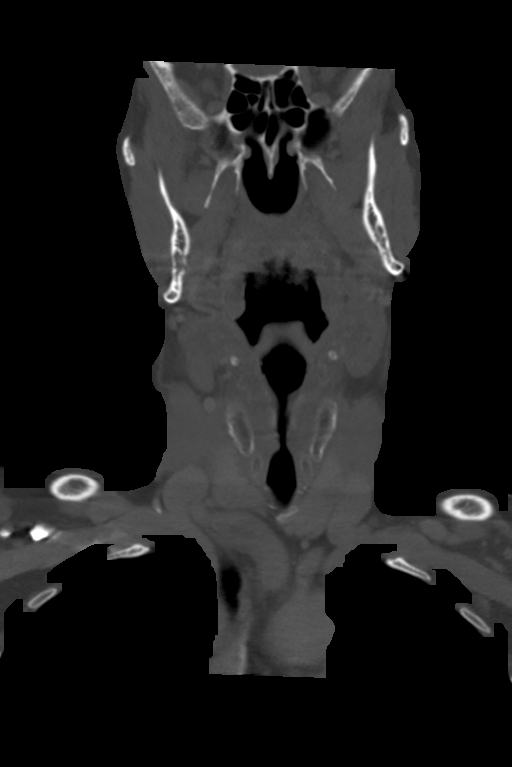
[im 44/77  bone]
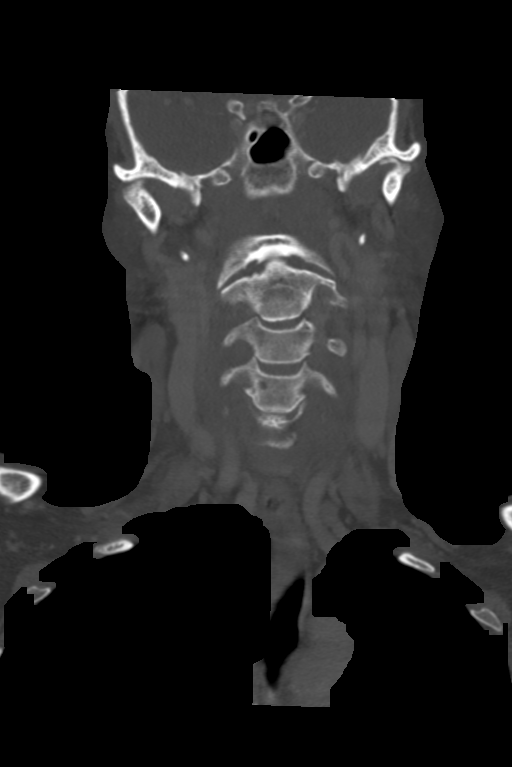

[Series 8: orthogonal ax · axial · 0.41mm/px · z∈[-310,-265]mm · 2 of 125 slices shown]
[im 25/125  bone]
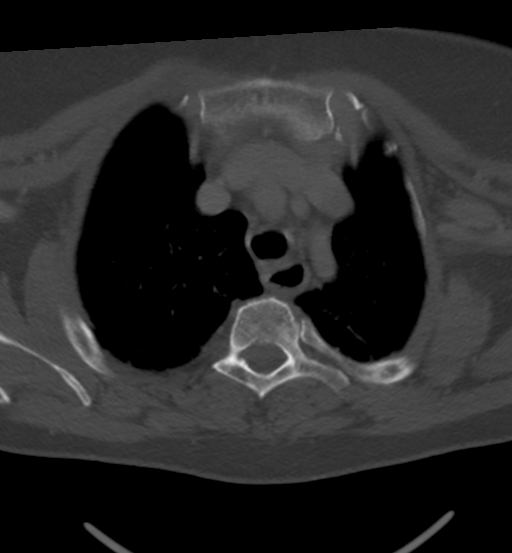
[im 50/125  bone]
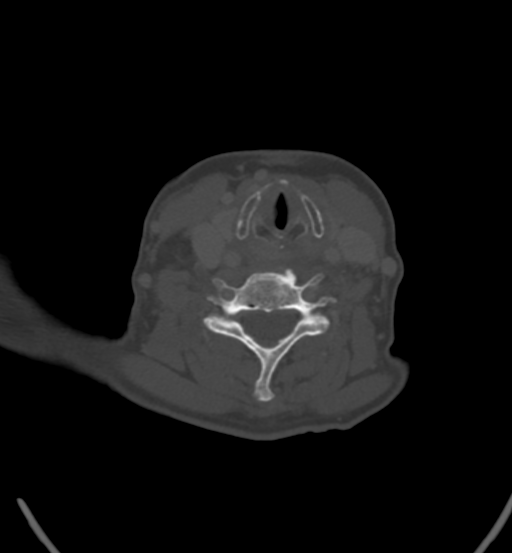

[14 of 33 positions shown; findings below may reference images not displayed]

FINDINGS: Pharynx and larynx: There appears to be mucosal thickening of the
posterior nasopharynx, oropharynx and hypopharynx consistent with
pharyngitis. No sign of abscess. No sign of critical airway
compromise. No evidence deep space abscess. Question tiny amount of
retropharyngeal effusion.

Salivary glands: Parotid and submandibular glands are normal.

Thyroid: Normal

Lymph nodes: Slightly prominent level 2 nodes consistent with
reactive inflammation. No evidence of suppuration.

Vascular: Normal

Limited intracranial: Normal

Visualized orbits: Normal

Mastoids and visualized paranasal sinuses: Clear/normal

Skeleton: Ordinary cervical spondylosis.

Upper chest: Benign appearing scarring at both apices.

Other: None
IMPRESSION: Pharyngitis pattern with mucosal prominence but no evidence of
advanced disease or drainable abscess. There may be a very small
retropharyngeal effusion but no evidence of loculated collection or
abscess. Mild reactive adenopathy of the level 2 nodes without
suppuration.

## 2020-12-09 ENCOUNTER — Other Ambulatory Visit: Payer: Self-pay | Admitting: Internal Medicine

## 2020-12-09 ENCOUNTER — Telehealth: Payer: Self-pay | Admitting: Internal Medicine

## 2020-12-09 DIAGNOSIS — R7303 Prediabetes: Secondary | ICD-10-CM

## 2020-12-09 DIAGNOSIS — E785 Hyperlipidemia, unspecified: Secondary | ICD-10-CM

## 2020-12-09 DIAGNOSIS — Z79899 Other long term (current) drug therapy: Secondary | ICD-10-CM

## 2020-12-09 DIAGNOSIS — G9332 Myalgic encephalomyelitis/chronic fatigue syndrome: Secondary | ICD-10-CM

## 2020-12-09 NOTE — Telephone Encounter (Signed)
PT schedule their physical for July 20 at 2:00pm and wants to know if she needs to have fasting labs done on that day like last time and if so would like to reschedule it.

## 2020-12-10 NOTE — Telephone Encounter (Signed)
Scheduled pt for a lab appt a few days before physical. I have ordered CBC, CMP, Lipid panel, A1c, and TSH. Is there anything else that needs to be ordered?

## 2020-12-10 NOTE — Addendum Note (Signed)
Addended by: Adair Laundry on: 12/10/2020 12:56 PM   Modules accepted: Orders

## 2020-12-16 ENCOUNTER — Encounter: Payer: Medicare HMO | Admitting: Internal Medicine

## 2021-01-12 ENCOUNTER — Other Ambulatory Visit: Payer: Self-pay | Admitting: Internal Medicine

## 2021-01-18 ENCOUNTER — Other Ambulatory Visit: Payer: Medicare HMO

## 2021-01-20 ENCOUNTER — Other Ambulatory Visit: Payer: Self-pay

## 2021-01-20 ENCOUNTER — Ambulatory Visit (INDEPENDENT_AMBULATORY_CARE_PROVIDER_SITE_OTHER): Payer: Medicare HMO | Admitting: Internal Medicine

## 2021-01-20 ENCOUNTER — Encounter: Payer: Self-pay | Admitting: Internal Medicine

## 2021-01-20 VITALS — BP 110/68 | HR 101 | Temp 96.9°F | Resp 14 | Ht 66.0 in | Wt 156.2 lb

## 2021-01-20 DIAGNOSIS — E785 Hyperlipidemia, unspecified: Secondary | ICD-10-CM | POA: Diagnosis not present

## 2021-01-20 DIAGNOSIS — Z0000 Encounter for general adult medical examination without abnormal findings: Secondary | ICD-10-CM

## 2021-01-20 DIAGNOSIS — R000 Tachycardia, unspecified: Secondary | ICD-10-CM | POA: Diagnosis not present

## 2021-01-20 DIAGNOSIS — Z23 Encounter for immunization: Secondary | ICD-10-CM

## 2021-01-20 DIAGNOSIS — R944 Abnormal results of kidney function studies: Secondary | ICD-10-CM

## 2021-01-20 DIAGNOSIS — Z1231 Encounter for screening mammogram for malignant neoplasm of breast: Secondary | ICD-10-CM

## 2021-01-20 MED ORDER — ZOSTER VAC RECOMB ADJUVANTED 50 MCG/0.5ML IM SUSR: 0.5000 mL | Freq: Once | INTRAMUSCULAR | 1 refills | Status: AC

## 2021-01-20 NOTE — Progress Notes (Signed)
Patient ID: Brandi Douglas, female    DOB: [birth year]  Age: 70 y.o. MRN: [id number]  The patient is here for follow up and management of other chronic and acute problems.   The risk factors are reflected in the social history.  The roster of all physicians providing medical care to patient - is listed in the Snapshot section of the chart.  Activities of daily living:  The patient is 100% independent in all ADLs: dressing, toileting, feeding as well as independent mobility  Home safety : The patient has smoke detectors in the home. They wear seatbelts.  There are no firearms at home. There is no violence in the home.   There is no risks for hepatitis, STDs or HIV. There is no   history of blood transfusion. They have no travel history to infectious disease endemic areas of the world.  The patient has seen their dentist in the last six month. They have seen their eye doctor in the last year. They admit to slight hearing difficulty with regard to whispered voices and some television programs.  They have deferred audiologic testing in the last year.  They do not  have excessive sun exposure. Discussed the need for sun protection: hats, long sleeves and use of sunscreen if there is significant sun exposure.   Diet: the importance of a healthy diet is discussed. They do have a healthy diet.  The benefits of regular aerobic exercise were discussed. She walks 4 times per week ,  20 minutes.   Depression screen: there are no signs or vegative symptoms of depression- irritability, change in appetite, anhedonia, sadness/tearfullness.  Cognitive assessment: the patient manages all their financial and personal affairs and is actively engaged. They could relate day,date,year and events; recalled 2/3 objects at 3 minutes; performed clock-face test normally.  The following portions of the patient's history were reviewed and updated as appropriate: allergies, current medications, past family history, past  medical history,  past surgical history, past social history  and problem list.  Visual acuity was not assessed per patient preference since she has regular follow up with her ophthalmologist. Hearing and body mass index were assessed and reviewed.   During the course of the visit the patient was educated and counseled about appropriate screening and preventive services including : fall prevention , diabetes screening, nutrition counseling, colorectal cancer screening, and recommended immunizations.    CC: The primary encounter diagnosis was Tachycardia, unspecified. Diagnoses of Dyslipidemia, COVID-19 vaccine administered, Breast cancer screening by mammogram, and Visit for preventive health examination were also pertinent to this visit.  Chronic fatigue,  GAD and IBS.  Eating smaller meals,  avoids sweets, grease and spices,  red meat.       History [place] has a past medical history of Chronic fatigue syndrome, Generalized anxiety disorder, and Irritable bowel syndrome.   She has a past surgical history that includes Cholecystectomy.   Her family history includes Hypertension (age of onset: 22) in her father; Hypothyroidism in her mother; Testicular cancer (age of onset: 46) in her son.She reports that she has never smoked. She has never used smokeless tobacco. She reports that she does not drink alcohol and does not use drugs.  Outpatient Medications Prior to Visit  Medication Sig Dispense Refill   ALPRAZolam (XANAX) 0.5 MG tablet Take 0.5-1 tablets (0.25-0.5 mg total) by mouth 2 (two) times daily as needed. For anxiety 30 tablet 5   DULoxetine (CYMBALTA) 60 MG capsule Take 60 mg by mouth daily.  hyoscyamine (LEVSIN SL) 0.125 MG SL tablet Place 1 tablet (0.125 mg total) under the tongue every 4 (four) hours as needed for cramping. 30 tablet 3   lisdexamfetamine (VYVANSE) 20 MG capsule Take 1 capsule by mouth daily.      omeprazole (PRILOSEC) 40 MG capsule TAKE 1 CAPSULE BY MOUTH ONCE  DAILY 90 capsule 0   ondansetron (ZOFRAN) 8 MG tablet Take 1 tablet (8 mg total) by mouth every 8 (eight) hours as needed. For nausea 20 tablet 5   SUMAtriptan (IMITREX) 100 MG tablet TAKE 1 TABLET BY MOUTH AT ONSET OF HEAD-ACHE. MAY TAKE AN ADDITIONAL TABLET IN 2 HOURS IF NEEDED. MAX 2/24. 10 tablet 3   tolterodine (DETROL LA) 4 MG 24 hr capsule TAKE 1 CAPSULE BY MOUTH ONCE DAILY 90 capsule 1   dicyclomine (BENTYL) 10 MG/5ML solution Take 10 mLs (20 mg total) by mouth 4 (four) times daily -  before meals and at bedtime. (Patient not taking: Reported on 01/20/2021) 240 mL 2   No facility-administered medications prior to visit.    Review of Systems  Patient denies headache, fevers, malaise, unintentional weight loss, skin rash, eye pain, sinus congestion and sinus pain, sore throat, dysphagia,  hemoptysis , cough, dyspnea, wheezing, chest pain, palpitations, orthopnea, edema, abdominal pain, nausea, melena, diarrhea, constipation, flank pain, dysuria, hematuria, urinary  Frequency, nocturia, numbness, tingling, seizures,  Focal weakness, Loss of consciousness,  Tremor, insomnia, depression, anxiety, and suicidal ideation.     Objective:  BP 110/68 (BP Location: Left Arm, Patient Position: Sitting, Cuff Size: Normal)   Pulse (!) 101   Temp (!) 96.9 F (36.1 C) (Temporal)   Resp 14   Ht 5\' 6"  (1.676 m)   Wt 156 lb 3.2 oz (70.9 kg)   SpO2 98%   BMI 25.21 kg/m   Physical Exam  General appearance: alert, cooperative and appears stated age Head: Normocephalic, without obvious abnormality, atraumatic Eyes: conjunctivae/corneas clear. PERRL, EOM's intact. Fundi benign. Ears: normal TM's and external ear canals both ears Nose: Nares normal. Septum midline. Mucosa normal. No drainage or sinus tenderness. Throat: lips, mucosa, and tongue normal; teeth and gums normal Neck: no adenopathy, no carotid bruit, no JVD, supple, symmetrical, trachea midline and thyroid not enlarged, symmetric, no  tenderness/mass/nodules Lungs: clear to auscultation bilaterally Breasts: normal appearance, no masses or tenderness Heart: regular rate and rhythm, S1, S2 normal, no murmur, click, rub or gallop Abdomen: soft, non-tender; bowel sounds normal; no masses,  no organomegaly Extremities: extremities normal, atraumatic, no cyanosis or edema Pulses: 2+ and symmetric Skin: Skin color, texture, turgor normal. No rashes or lesions Neurologic: Alert and oriented X 3, normal strength and tone. Normal symmetric reflexes. Normal coordination and gait.     Assessment & Plan:   Problem List Items Addressed This Visit       Unprioritized   Visit for preventive health examination    age appropriate education and counseling updated, referrals for preventative services and immunizations addressed, dietary and smoking counseling addressed, most recent labs reviewed.  I have personally reviewed and have noted:   1) the patient's medical and social history 2) The pt's use of alcohol, tobacco, and illicit drugs 3) The patient's current medications and supplements 4) Functional ability including ADL's, fall risk, home safety risk, hearing and visual impairment 5) Diet and physical activities 6) Evidence for depression or mood disorder 7) The patient's height, weight, and BMI have been recorded in the chart   I have made referrals, and provided  counseling and education based on review of the above       Other Visit Diagnoses     Tachycardia, unspecified    -  Primary   Relevant Orders   Comprehensive metabolic panel (Completed)   TSH (Completed)   CBC with Differential/Platelet (Completed)   Dyslipidemia       Relevant Orders   Lipid panel (Completed)   COVID-19 vaccine administered       Relevant Orders   SARS-CoV-2 Semi-Quantitative Total Antibody, Spike   Breast cancer screening by mammogram       Relevant Orders   MM DIGITAL SCREENING BILATERAL       I am having Hadassah Pais start on  Zoster Vaccine Adjuvanted. I am also having her maintain her lisdexamfetamine, DULoxetine, ALPRAZolam, dicyclomine, SUMAtriptan, ondansetron, hyoscyamine, tolterodine, and omeprazole.  Meds ordered this encounter  Medications   Zoster Vaccine Adjuvanted [facility]) injection    Sig: Inject 0.5 mLs into the muscle once for 1 dose.    Dispense:  1 each    Refill:  1    There are no discontinued medications.  Follow-up: No follow-ups on file.   Crecencio Mc, MD

## 2021-01-20 NOTE — Patient Instructions (Addendum)
It appears that your last colonoscopy was in 2015 by Arther Dames from [place] will be  repeated  in 2023  Mammo due in Sept  and ordered today   No more PAP  smears    PCV 20 (Pneumococcal Conjugate Vaccine) is recommended for all healthy adults at age 69 and it is the last pneumonia vaccine you will ever need It can  be done here   The ShingRx vaccine is now available in local pharmacies and is much more protective than Zostavaxs,  It is therefore ADVISED for all interested adults over 50 to prevent shingles.

## 2021-01-21 LAB — LIPID PANEL
Cholesterol: 180 mg/dL (ref 0–200)
HDL: 67.1 mg/dL (ref >39.00)
LDL Cholesterol: 94 mg/dL (ref 0–99)
NonHDL: 112.63
Total CHOL/HDL Ratio: 3
Triglycerides: 91 mg/dL (ref 0.0–149.0)
VLDL: 18.2 mg/dL (ref 0.0–40.0)

## 2021-01-21 LAB — COMPREHENSIVE METABOLIC PANEL
ALT: 12 U/L (ref 0–35)
AST: 19 U/L (ref 0–37)
Albumin: 4.7 g/dL (ref 3.5–5.2)
Alkaline Phosphatase: 99 U/L (ref 39–117)
BUN: 16 mg/dL (ref 6–23)
CO2: 25 mEq/L (ref 19–32)
Calcium: 10.1 mg/dL (ref 8.4–10.5)
Chloride: 101 mEq/L (ref 96–112)
Creatinine, Ser: 1.03 mg/dL (ref 0.40–1.20)
GFR: 55.83 mL/min — ABNORMAL LOW (ref >60.00)
Glucose, Bld: 96 mg/dL (ref 70–99)
Potassium: 3.9 mEq/L (ref 3.5–5.1)
Sodium: 137 mEq/L (ref 135–145)
Total Bilirubin: 0.7 mg/dL (ref 0.2–1.2)
Total Protein: 7 g/dL (ref 6.0–8.3)

## 2021-01-21 LAB — CBC WITH DIFFERENTIAL/PLATELET
Basophils Absolute: 0 10*3/uL (ref 0.0–0.1)
Basophils Relative: 0.6 % (ref 0.0–3.0)
Eosinophils Absolute: 0 10*3/uL (ref 0.0–0.7)
Eosinophils Relative: 0.6 % (ref 0.0–5.0)
HCT: 38.6 % (ref 36.0–46.0)
Hemoglobin: 13.3 g/dL (ref 12.0–15.0)
Lymphocytes Relative: 20.1 % (ref 12.0–46.0)
Lymphs Abs: 1.4 10*3/uL (ref 0.7–4.0)
MCHC: 34.4 g/dL (ref 30.0–36.0)
MCV: 92.3 fl (ref 78.0–100.0)
Monocytes Absolute: 0.4 10*3/uL (ref 0.1–1.0)
Monocytes Relative: 6.3 % (ref 3.0–12.0)
Neutro Abs: 4.9 10*3/uL (ref 1.4–7.7)
Neutrophils Relative %: 72.4 % (ref 43.0–77.0)
Platelets: 186 10*3/uL (ref 150.0–400.0)
RBC: 4.18 Mil/uL (ref 3.87–5.11)
RDW: 12.3 % (ref 11.5–15.5)
WBC: 6.7 10*3/uL (ref 4.0–10.5)

## 2021-01-21 LAB — TSH: TSH: 1.69 u[IU]/mL (ref 0.35–5.50)

## 2021-01-23 NOTE — Assessment & Plan Note (Signed)

## 2021-01-24 LAB — SARS-COV-2 SEMI-QUANTITATIVE TOTAL ANTIBODY, SPIKE: SARS COV2 AB, Total Spike Semi QN: >2500 U/mL — ABNORMAL HIGH (ref <0.8)

## 2021-01-24 NOTE — Addendum Note (Signed)
Addended by: Crecencio Mc on: 01/24/2021 03:43 PM   Modules accepted: Orders

## 2021-01-25 NOTE — Progress Notes (Signed)
You have a very robust COVID antibody titre.  You should be able to postpone your booster until Moderna releases its updated vaccine in the fall.  Regards,   Deborra Medina, MD

## 2021-04-03 ENCOUNTER — Other Ambulatory Visit: Payer: Self-pay | Admitting: Internal Medicine

## 2021-04-19 ENCOUNTER — Other Ambulatory Visit: Payer: Self-pay | Admitting: Internal Medicine

## 2021-06-11 LAB — HM MAMMOGRAPHY

## 2021-06-18 ENCOUNTER — Ambulatory Visit: Payer: Medicare HMO

## 2021-06-18 ENCOUNTER — Telehealth: Payer: Self-pay

## 2021-06-18 NOTE — Telephone Encounter (Signed)
No answer when called for scheduled AWV. Left message to reschedule.  ?

## 2021-07-20 ENCOUNTER — Other Ambulatory Visit: Payer: Self-pay | Admitting: Internal Medicine

## 2021-07-28 ENCOUNTER — Other Ambulatory Visit: Payer: Self-pay | Admitting: Internal Medicine

## 2021-07-29 ENCOUNTER — Telehealth: Payer: Self-pay

## 2021-07-29 NOTE — Telephone Encounter (Signed)
PA for Sumatriptan has been submitted on covermymeds.

## 2021-08-03 NOTE — Telephone Encounter (Signed)
PA approved through 07/03/2022.  

## 2021-10-18 ENCOUNTER — Other Ambulatory Visit: Payer: Self-pay | Admitting: Internal Medicine

## 2021-10-25 ENCOUNTER — Ambulatory Visit (INDEPENDENT_AMBULATORY_CARE_PROVIDER_SITE_OTHER): Payer: Medicare HMO

## 2021-10-25 VITALS — Ht 66.0 in | Wt 155.0 lb

## 2021-10-25 DIAGNOSIS — Z0000 Encounter for general adult medical examination without abnormal findings: Secondary | ICD-10-CM | POA: Diagnosis not present

## 2021-10-25 NOTE — Progress Notes (Addendum)
Subjective:   Brandi Douglas is a 71 y.o. female who presents for Medicare Annual (Subsequent) preventive examination.  Review of Systems    No ROS.  Medicare Wellness Virtual Visit.  Visual/audio telehealth visit, UTA vital signs.   See social history for additional risk factors.   Cardiac Risk Factors include: advanced age (>61men, >58 women)     Objective:    Today's Vitals   10/25/21 1514  Weight: 155 lb (70.3 kg)  Height: 5\' 6"  (1.676 m)   Body mass index is 25.02 kg/m.     10/25/2021    3:19 PM 06/17/2020    1:42 PM 03/20/2019   10:50 AM 04/26/2018    7:00 PM 04/26/2018   12:21 PM  Advanced Directives  Does Patient Have a Medical Advance Directive? Yes Yes Yes Yes No  Type of Estate agent of [place];Living will Healthcare Power of [place];Living will Healthcare Power of [place];Living will Healthcare Power of Attorney   Does patient want to make changes to medical advance directive? No - Patient declined No - Patient declined No - Patient declined No - Patient declined   Copy of Healthcare Power of Attorney in Chart? No - copy requested No - copy requested No - copy requested No - copy requested   Would patient like information on creating a medical advance directive?    No - Patient declined No - Patient declined   Current Medications (verified) Outpatient Encounter Medications as of 10/25/2021  Medication Sig   ALPRAZolam (XANAX) 0.5 MG tablet Take 0.5-1 tablets (0.25-0.5 mg total) by mouth 2 (two) times daily as needed. For anxiety   dicyclomine (BENTYL) 10 MG/5ML solution Take 10 mLs (20 mg total) by mouth 4 (four) times daily -  before meals and at bedtime. (Patient not taking: Reported on 01/20/2021)   DULoxetine (CYMBALTA) 60 MG capsule Take 60 mg by mouth daily.    hyoscyamine (LEVSIN SL) 0.125 MG SL tablet Place 1 tablet (0.125 mg total) under the tongue every 4 (four) hours as needed for cramping.   lisdexamfetamine (VYVANSE) 20 MG  capsule Take 1 capsule by mouth daily.    omeprazole (PRILOSEC) 40 MG capsule TAKE 1 CAPSULE BY MOUTH ONCE DAILY   ondansetron (ZOFRAN) 8 MG tablet Take 1 tablet (8 mg total) by mouth every 8 (eight) hours as needed. For nausea   SUMAtriptan (IMITREX) 100 MG tablet TAKE 1 TABLET BY MOUTH AT ONSET OF HEAD-ACHE. MAY TAKE AN ADDITIONAL TABLET IN 2 HOURS IF NEEDED. MAX 2/24.   tolterodine (DETROL LA) 4 MG 24 hr capsule TAKE 1 CAPSULE BY MOUTH ONCE DAILY   No facility-administered encounter medications on file as of 10/25/2021.   Allergies (verified) Aspirin, Dexilant [dexlansoprazole], and Lubiprostone   History: Past Medical History:  Diagnosis Date   Chronic fatigue syndrome    Generalized anxiety disorder    Irritable bowel syndrome    Past Surgical History:  Procedure Laterality Date   CHOLECYSTECTOMY     Family History  Problem Relation Age of Onset   Hypothyroidism Mother    Hypertension Father 69       died in March 11, 2023 of complications during pacemaker insertion    Testicular cancer Son 30   Social History   Socioeconomic History   Marital status: Married    Spouse name: Renae Fickle    Number of children: Not on file   Years of education: Not on file   Highest education level: Not on file  Occupational History  Occupation: Programmer, multimedia: [employer]  Tobacco Use   Smoking status: Never   Smokeless tobacco: Never  Substance and Sexual Activity   Alcohol use: No   Drug use: No   Sexual activity: Yes    Partners: Male  Other Topics Concern   Not on file  Social History Narrative   Married    Social Determinants of Health   Financial Resource Strain: Low Risk    Difficulty of Paying Living Expenses: Not hard at all  Food Insecurity: No Food Insecurity   Worried About Programme researcher, broadcasting/film/video in the Last Year: Never true   Barista in the Last Year: Never true  Transportation Needs: No Transportation Needs   Lack of Transportation (Medical):  No   Lack of Transportation (Non-Medical): No  Physical Activity: Not on file  Stress: No Stress Concern Present   Feeling of Stress : Not at all  Social Connections: Not on file   Tobacco Counseling Counseling given: Not Answered  Clinical Intake: Pre-visit preparation completed: Yes        Diabetes: No  How often do you need to have someone help you when you read instructions, pamphlets, or other written materials from your doctor or pharmacy?: 1 - Never  Interpreter Needed?: No    Activities of Daily Living    10/25/2021    3:20 PM  In your present state of health, do you have any difficulty performing the following activities:  Hearing? 0  Vision? 0  Difficulty concentrating or making decisions? 0  Walking or climbing stairs? 0  Dressing or bathing? 0  Doing errands, shopping? 0  Preparing Food and eating ? N  Using the Toilet? N  In the past six months, have you accidently leaked urine? N  Do you have problems with loss of bowel control? N  Managing your Medications? N  Managing your Finances? N  Housekeeping or managing your Housekeeping? N   Patient Care Team: Sherlene Shams, MD as PCP - General (Internal Medicine)  Indicate any recent Medical Services you may have received from other than [facility] providers in the past year (date may be approximate).     Assessment:   This is a routine wellness examination for Brandi Douglas.  Virtual Visit via Telephone Note  I connected with  Brandi Douglas on 10/25/21 at  3:15 PM EDT by telephone and verified that I am speaking with the correct person using two identifiers.  Persons participating in the virtual visit: patient/Nurse Health Advisor   I discussed the limitations of performing an evaluation and management service by telehealth. The patient expressed understanding and agreed to proceed. We continued and completed visit with audio only. Some vital signs may be absent or patient reported.   Hearing/Vision  screen Hearing Screening - Comments:: Patient is able to hear conversational tones without difficulty.  No issues reported. Vision Screening - Comments:: Wears corrective lenses  Dietary issues and exercise activities discussed: Current Exercise Habits: Home exercise routine, Intensity: Mild Healthy diet Good water intake    Goals Addressed               This Visit's Progress     Patient Stated     Maintain Healthy Lifestyle (pt-stated)        Stay active Healthy diet       Depression Screen    10/25/2021    3:20 PM 01/20/2021    2:07 PM 06/17/2020  1:43 PM 03/20/2019   10:47 AM 04/24/2018    2:46 PM 04/17/2017    3:05 PM  PHQ 2/9 Scores  PHQ - 2 Score 0 0 0 0 0 2  PHQ- 9 Score      5    Fall Risk    10/25/2021    3:20 PM 01/20/2021    2:07 PM 06/17/2020    1:43 PM 12/11/2019    8:41 AM 03/20/2019   10:47 AM  Fall Risk   Falls in the past year? 0 1 0 0 0  Number falls in past yr: 0 0 0    Injury with Fall?  0     Risk for fall due to :  History of fall(s)     Follow up Falls evaluation completed Falls evaluation completed Falls evaluation completed Falls evaluation completed    FALL RISK PREVENTION PERTAINING TO THE HOME: Home free of loose throw rugs in walkways, pet beds, electrical cords, etc? Yes  Adequate lighting in your home to reduce risk of falls? Yes   ASSISTIVE DEVICES UTILIZED TO PREVENT FALLS: Life alert? No  Use of a cane, [place] or w/c? No   TIMED UP AND GO: Was the test performed? No .   Cognitive Function:  Patient is alert and oriented x3.       03/20/2019   11:00 AM  6CIT Screen  What Year? 0 points  What month? 0 points  What time? 0 points  Count back from 20 0 points  Months in reverse 0 points  Repeat phrase 0 points  Total Score 0 points    Immunizations Immunization History  Administered Date(s) Administered   Influenza Split 03/13/2014   Influenza, High Dose Seasonal PF 05/07/2019   Influenza,inj,Quad PF,6+ Mos  05/06/2015, 05/17/2016   Influenza-Unspecified 03/04/2012, 05/03/2013, 04/19/2017, 04/19/2018   Moderna Sars-Covid-2 Vaccination 07/25/2019, 08/28/2019, 07/19/2020, 06/14/2021   PPD Test 02/05/2014   Tdap 01/18/2013   Zoster, Live 04/04/2011   Pneumococcal vaccine status: Due, Education has been provided regarding the importance of this vaccine. Advised may receive this vaccine at local pharmacy or Health Dept. Aware to provide a copy of the vaccination record if obtained from local pharmacy or Health Dept. Verbalized acceptance and understanding.  Shingrix Completed?: No.    Education has been provided regarding the importance of this vaccine. Patient has been advised to call insurance company to determine out of pocket expense if they have not yet received this vaccine. Advised may also receive vaccine at local pharmacy or Health Dept. Verbalized acceptance and understanding.  Screening Tests Health Maintenance  Topic Date Due   COVID-19 Vaccine (5 - Booster for Moderna series) 11/10/2021 (Originally 08/09/2021)   Zoster Vaccines- Shingrix (1 of 2) 01/24/2022 (Originally 06/12/1970)   Pneumonia Vaccine 75+ Years old (1 - PCV) 02/01/2022 (Originally 06/11/2017)   INFLUENZA VACCINE  02/01/2022   MAMMOGRAM  06/11/2022   TETANUS/TDAP  01/19/2023   COLONOSCOPY (Pts 45-52yrs Insurance coverage will need to be confirmed)  03/19/2024   DEXA SCAN  Completed   Hepatitis C Screening  Completed   HPV VACCINES  Aged Out   Health Maintenance There are no preventive care reminders to display for this patient.  Lung Cancer Screening: (Low Dose CT Chest recommended if Age 59-80 years, 30 pack-year currently smoking OR have quit w/in 15years.) does not qualify.   Vision Screening: Recommended annual ophthalmology exams for early detection of glaucoma and other disorders of the eye.  Dental Screening: Recommended annual dental  exams for proper oral hygiene  Community Resource Referral / Chronic Care  Management: CRR required this visit?  No   CCM required this visit?  No      Plan:   Keep all routine maintenance appointments.   I have personally reviewed and noted the following in the patient's chart:   Medical and social history Use of alcohol, tobacco or illicit drugs  Current medications and supplements including opioid prescriptions.  Functional ability and status Nutritional status Physical activity Advanced directives List of other physicians Hospitalizations, surgeries, and ER visits in previous 12 months Vitals Screenings to include cognitive, depression, and falls Referrals and appointments  In addition, I have reviewed and discussed with patient certain preventive protocols, quality metrics, and best practice recommendations. A written personalized care plan for preventive services as well as general preventive health recommendations were provided to patient.     OBrien-Blaney, Siegfried Vieth L, LPN   1/61/0960    I have reviewed the above information and agree with above.   Duncan Dull, MD

## 2021-10-25 NOTE — Patient Instructions (Addendum)
?  Brandi Douglas , ?Thank you for taking time to come for your Medicare Wellness Visit. I appreciate your ongoing commitment to your health goals. Please review the following plan we discussed and let me know if I can assist you in the future.  ? ?These are the goals we discussed: ? Goals   ? ?  ? Patient Stated  ?   Maintain Healthy Lifestyle (pt-stated)   ?   Stay active ?Healthy diet ?  ? ?  ?  ?This is a list of the screening recommended for you and due dates:  ?Health Maintenance  ?Topic Date Due  ? COVID-19 Vaccine (5 - Booster for Moderna series) 11/10/2021*  ? Zoster (Shingles) Vaccine (1 of 2) 01/24/2022*  ? Pneumonia Vaccine (1 - PCV) 02/01/2022*  ? Flu Shot  02/01/2022  ? Mammogram  06/11/2022  ? Tetanus Vaccine  01/19/2023  ? Colon Cancer Screening  03/19/2024  ? DEXA scan (bone density measurement)  Completed  ? Hepatitis C Screening: USPSTF Recommendation to screen - Ages 59-79 yo.  Completed  ? HPV Vaccine  Aged Out  ?*Topic was postponed. The date shown is not the original due date.  ?  ?

## 2021-11-16 ENCOUNTER — Telehealth: Payer: Self-pay | Admitting: Internal Medicine

## 2021-11-16 NOTE — Telephone Encounter (Signed)
Spoke with pt and scheduled her for an appt on Thursday. Dr. Derrel Nip is aware and approved the appt.  ?

## 2021-11-16 NOTE — Telephone Encounter (Signed)
Please call Brandi Douglas and set her up to see me . Dr Nicolasa Ducking called me about her episdoes of tachycardia and weakness.  She'll need an EKG at visit  ?

## 2021-11-18 ENCOUNTER — Encounter: Payer: Self-pay | Admitting: Internal Medicine

## 2021-11-18 ENCOUNTER — Ambulatory Visit (INDEPENDENT_AMBULATORY_CARE_PROVIDER_SITE_OTHER): Payer: Medicare HMO | Admitting: Internal Medicine

## 2021-11-18 ENCOUNTER — Other Ambulatory Visit: Payer: Self-pay | Admitting: Internal Medicine

## 2021-11-18 VITALS — BP 136/88 | HR 121 | Temp 98.0°F | Ht 66.0 in | Wt 157.4 lb

## 2021-11-18 DIAGNOSIS — R000 Tachycardia, unspecified: Secondary | ICD-10-CM

## 2021-11-18 DIAGNOSIS — R5383 Other fatigue: Secondary | ICD-10-CM

## 2021-11-18 LAB — CBC WITH DIFFERENTIAL/PLATELET
Basophils Absolute: 0 K/uL (ref 0.0–0.1)
Basophils Relative: 0.4 % (ref 0.0–3.0)
Eosinophils Absolute: 0 K/uL (ref 0.0–0.7)
Eosinophils Relative: 0.7 % (ref 0.0–5.0)
HCT: 39.3 % (ref 36.0–46.0)
Hemoglobin: 13.5 g/dL (ref 12.0–15.0)
Lymphocytes Relative: 29.6 % (ref 12.0–46.0)
Lymphs Abs: 1.4 K/uL (ref 0.7–4.0)
MCHC: 34.3 g/dL (ref 30.0–36.0)
MCV: 92.3 fl (ref 78.0–100.0)
Monocytes Absolute: 0.4 K/uL (ref 0.1–1.0)
Monocytes Relative: 9.5 % (ref 3.0–12.0)
Neutro Abs: 2.8 K/uL (ref 1.4–7.7)
Neutrophils Relative %: 59.8 % (ref 43.0–77.0)
Platelets: 178 K/uL (ref 150.0–400.0)
RBC: 4.26 Mil/uL (ref 3.87–5.11)
RDW: 12.4 % (ref 11.5–15.5)
WBC: 4.7 K/uL (ref 4.0–10.5)

## 2021-11-18 LAB — COMPREHENSIVE METABOLIC PANEL
ALT: 16 U/L (ref 0–35)
AST: 19 U/L (ref 0–37)
Albumin: 4.5 g/dL (ref 3.5–5.2)
Alkaline Phosphatase: 91 U/L (ref 39–117)
BUN: 14 mg/dL (ref 6–23)
CO2: 28 mEq/L (ref 19–32)
Calcium: 10 mg/dL (ref 8.4–10.5)
Chloride: 106 mEq/L (ref 96–112)
Creatinine, Ser: 0.87 mg/dL (ref 0.40–1.20)
GFR: 67.97 mL/min (ref >60.00)
Glucose, Bld: 96 mg/dL (ref 70–99)
Potassium: 4.3 mEq/L (ref 3.5–5.1)
Sodium: 139 mEq/L (ref 135–145)
Total Bilirubin: 0.5 mg/dL (ref 0.2–1.2)
Total Protein: 6.9 g/dL (ref 6.0–8.3)

## 2021-11-18 LAB — B12 AND FOLATE PANEL
Folate: >23.5 ng/mL (ref >5.9)
Vitamin B-12: 364 pg/mL (ref 211–911)

## 2021-11-18 LAB — TSH: TSH: 1.3 u[IU]/mL (ref 0.35–5.50)

## 2021-11-18 LAB — MAGNESIUM: Magnesium: 1.9 mg/dL (ref 1.5–2.5)

## 2021-11-18 MED ORDER — DILTIAZEM HCL ER COATED BEADS 120 MG PO CP24: 120.0000 mg | ORAL_CAPSULE | Freq: Every day | ORAL | 2 refills | Status: DC

## 2021-11-18 NOTE — Progress Notes (Signed)
Subjective:  Patient ID: Brandi Douglas, female    DOB: [birth year]  Age: 71 y.o. MRN: [id number]  CC: The primary encounter diagnosis was Other fatigue. A diagnosis of Tachycardia was also pertinent to this visit.   HPI Brandi Douglas presents for  Chief Complaint  Patient presents with   Follow-up    tachycardia   71 yr old female with chronic fatigue , managed with Vyvanse ,  referred  by Dr Nicolasa Ducking for evaluation of  symptomatic tachycardia noted during recent in person visit.     Patient states that she recently had an episode of rapid pulse detected by her watch (rate  114) accompanied by diaphoresis.  She  ,  iced her face,  lay down and raised legs , and pulse gradually improved but did not drop below  100 .  She denies chest pain,  dyspnea and edema.  States that her tachycardia is longstanding and precedes use of Vyvanse ; does not preclude her from regular participation in exercise.  Works out regularly at [employer].  Pulse reaches a high of 145 and then plateaus at 130 if she continues walking.  Marland Kitchen  No prior cardiac evaluation  except a nuclear medicine study in 2001 by [facility] .  Wants to see her husband's cardiologist,  Dr Rockey Situ.She is taking the 20 mg capsules and reducing the contents by 25% because feels that 15 mg is the appropriate.   Drinks 24 ounces of ice tea daily (weak,  no coffee,  no alcohol bc of gastritis /GERD )      IBS:  Has been having a flare of IBS lately after returning from a visit with mother.  Constipation , bloating after eating even a small meal.  Last BM 4 days ago (Monday) achieved with Dulcolax.  feels her stomach does not empty quickly enough,  used to take Propulsid.  Using Doterra oils as a topical rub to abdomen .  Discussed referral to Dr Tarri Glenn for IBS     Outpatient Medications Prior to Visit  Medication Sig Dispense Refill   ALPRAZolam (XANAX) 0.5 MG tablet Take 0.5-1 tablets (0.25-0.5 mg total) by mouth 2 (two) times daily as needed. For anxiety  30 tablet 5   DULoxetine (CYMBALTA) 60 MG capsule Take 60 mg by mouth daily.      hyoscyamine (LEVSIN SL) 0.125 MG SL tablet Place 1 tablet (0.125 mg total) under the tongue every 4 (four) hours as needed for cramping. 30 tablet 3   lisdexamfetamine (VYVANSE) 20 MG capsule Take 1 capsule by mouth daily.      omeprazole (PRILOSEC) 40 MG capsule TAKE 1 CAPSULE BY MOUTH ONCE DAILY 90 capsule 0   ondansetron (ZOFRAN) 8 MG tablet Take 1 tablet (8 mg total) by mouth every 8 (eight) hours as needed. For nausea 20 tablet 5   SUMAtriptan (IMITREX) 100 MG tablet TAKE 1 TABLET BY MOUTH AT ONSET OF HEAD-ACHE. MAY TAKE AN ADDITIONAL TABLET IN 2 HOURS IF NEEDED. MAX 2/24. 10 tablet 3   tolterodine (DETROL LA) 4 MG 24 hr capsule TAKE 1 CAPSULE BY MOUTH ONCE DAILY 90 capsule 1   dicyclomine (BENTYL) 10 MG/5ML solution Take 10 mLs (20 mg total) by mouth 4 (four) times daily -  before meals and at bedtime. (Patient not taking: Reported on 01/20/2021) 240 mL 2   No facility-administered medications prior to visit.    Review of Systems;  Patient denies headache, fevers, malaise, unintentional weight loss, skin rash, eye  pain, sinus congestion and sinus pain, sore throat, dysphagia,  hemoptysis , cough, dyspnea, wheezing, chest pain, palpitations, orthopnea, edema, abdominal pain, nausea, melena, diarrhea, constipation, flank pain, dysuria, hematuria, urinary  Frequency, nocturia, numbness, tingling, seizures,  Focal weakness, Loss of consciousness,  Tremor, insomnia, depression, anxiety, and suicidal ideation.      Objective:  BP 136/88 (BP Location: Left Arm, Patient Position: Sitting, Cuff Size: Normal)   Pulse (!) 121   Temp 98 F (36.7 C) (Oral)   Ht '5\' 6"'$  (1.676 m)   Wt 157 lb 6.4 oz (71.4 kg)   SpO2 97%   BMI 25.41 kg/m   BP Readings from Last 3 Encounters:  11/18/21 136/88  01/20/21 110/68  12/11/19 114/62    Wt Readings from Last 3 Encounters:  11/18/21 157 lb 6.4 oz (71.4 kg)  10/25/21 155  lb (70.3 kg)  01/20/21 156 lb 3.2 oz (70.9 kg)    General appearance: alert, cooperative and appears stated age Ears: normal TM's and external ear canals both ears Throat: lips, mucosa, and tongue normal; teeth and gums normal Neck: no adenopathy, no carotid bruit, supple, symmetrical, trachea midline and thyroid not enlarged, symmetric, no tenderness/mass/nodules Back: symmetric, no curvature. ROM normal. No CVA tenderness. Lungs: clear to auscultation bilaterally Heart: regular rate and rhythm, S1, S2 normal, no murmur, click, rub or gallop Abdomen: soft, non-tender; bowel sounds normal; no masses,  no organomegaly Pulses: 2+ and symmetric Skin: Skin color, texture, turgor normal. No rashes or lesions Lymph nodes: Cervical, supraclavicular, and axillary nodes normal.  No results found for: HGBA1C  Lab Results  Component Value Date   CREATININE 0.87 11/18/2021   CREATININE 1.03 01/20/2021   CREATININE 0.78 12/11/2019    Lab Results  Component Value Date   WBC 4.7 11/18/2021   HGB 13.5 11/18/2021   HCT 39.3 11/18/2021   PLT 178.0 11/18/2021   GLUCOSE 96 11/18/2021   CHOL 180 01/20/2021   TRIG 91.0 01/20/2021   HDL 67.10 01/20/2021   LDLDIRECT 125.0 04/14/2017   LDLCALC 94 01/20/2021   ALT 16 11/18/2021   AST 19 11/18/2021   NA 139 11/18/2021   K 4.3 11/18/2021   CL 106 11/18/2021   CREATININE 0.87 11/18/2021   BUN 14 11/18/2021   CO2 28 11/18/2021   TSH 1.30 11/18/2021    CT Soft Tissue Neck W Contrast  Result Date: 04/26/2018 CLINICAL DATA:  Sore throat worsening over the last 3 days. Unable to swallow. EXAM: CT NECK WITH CONTRAST TECHNIQUE: Multidetector CT imaging of the neck was performed using the standard protocol following the bolus administration of intravenous contrast. CONTRAST:  62m OMNIPAQUE IOHEXOL 300 MG/ML  SOLN COMPARISON:  None. FINDINGS: Pharynx and larynx: There appears to be mucosal thickening of the posterior nasopharynx, oropharynx and  hypopharynx consistent with pharyngitis. No sign of abscess. No sign of critical airway compromise. No evidence deep space abscess. Question tiny amount of retropharyngeal effusion. Salivary glands: Parotid and submandibular glands are normal. Thyroid: Normal Lymph nodes: Slightly prominent level 2 nodes consistent with reactive inflammation. No evidence of suppuration. Vascular: Normal Limited intracranial: Normal Visualized orbits: Normal Mastoids and visualized paranasal sinuses: Clear/normal Skeleton: Ordinary cervical spondylosis. Upper chest: Benign appearing scarring at both apices. Other: None IMPRESSION: Pharyngitis pattern with mucosal prominence but no evidence of advanced disease or drainable abscess. There may be a very small retropharyngeal effusion but no evidence of loculated collection or abscess. Mild reactive adenopathy of the level 2 nodes without suppuration. Electronically Signed  By: Nelson Chimes M.D.   On: 04/26/2018 14:18    Assessment & Plan:   Problem List Items Addressed This Visit     Tachycardia    Chronic.  Screening labs normal.  I have ordered and reviewed a 12 lead EKG and find that there are no acute changes and patient is in sinus rhythm.  With sinus tachycardia.  Unclear if the chronic fatigue is due to tachycardia or vice versa.  Cardiology evaluation recommended. She recalls a history of beta blocker intolerance.  Trial of cardizem CD        Relevant Orders   EKG 12-Lead (Completed)   CBC with Differential/Platelet (Completed)   TSH (Completed)   Magnesium (Completed)   Comprehensive metabolic panel (Completed)   Ambulatory referral to Cardiology   Other Visit Diagnoses     Other fatigue    -  Primary   Relevant Orders   CBC with Differential/Platelet (Completed)   B12 and Folate Panel (Completed)       I spent a total of   28  minutes with this patient in a face to face visit on the date of this encounter reviewing the last office visit with me  in November , discussing her history with Dr. Nicolasa Ducking, ,  reviewing patient's activity level and diet , her home bpulse reading , current EKG  ,   and post visit ordering of testing and therapeutics.    Follow-up: No follow-ups on file.   Crecencio Mc, MD

## 2021-11-18 NOTE — Patient Instructions (Addendum)
For the constipation:  Use benefiber and docusate  ("Colace)  nightly   Limit dulcolax to every 3 days   Take omeprazole  on an empty stomach 30 minutes or more before eating  or 2 hours after eating   Referral to Dr Laurier Nancy.  [facility] offered: let me know if you would like to go   For the tachycardia   Referral to Dr Rockey Situ to have your heart evaluated   Trial of cardizem at night

## 2021-11-19 DIAGNOSIS — R000 Tachycardia, unspecified: Secondary | ICD-10-CM | POA: Insufficient documentation

## 2021-11-19 NOTE — Assessment & Plan Note (Addendum)
Chronic.  Screening labs normal.  I have ordered and reviewed a 12 lead EKG and find that there are no acute changes and patient is in sinus rhythm.  With sinus tachycardia.  Unclear if the chronic fatigue is due to tachycardia or vice versa.  Cardiology evaluation recommended. She recalls a history of beta blocker intolerance.  Trial of cardizem CD

## 2021-12-01 ENCOUNTER — Encounter: Payer: Self-pay | Admitting: Internal Medicine

## 2022-01-24 ENCOUNTER — Other Ambulatory Visit: Payer: Self-pay | Admitting: Internal Medicine

## 2022-02-03 ENCOUNTER — Encounter: Payer: Self-pay | Admitting: Internal Medicine

## 2022-02-03 ENCOUNTER — Encounter: Payer: Medicare HMO | Admitting: Internal Medicine

## 2022-02-03 DIAGNOSIS — R000 Tachycardia, unspecified: Secondary | ICD-10-CM

## 2022-02-03 DIAGNOSIS — E785 Hyperlipidemia, unspecified: Secondary | ICD-10-CM

## 2022-02-03 DIAGNOSIS — Z0000 Encounter for general adult medical examination without abnormal findings: Secondary | ICD-10-CM

## 2022-02-03 DIAGNOSIS — R7303 Prediabetes: Secondary | ICD-10-CM

## 2022-02-03 DIAGNOSIS — R5383 Other fatigue: Secondary | ICD-10-CM

## 2022-03-09 ENCOUNTER — Ambulatory Visit: Payer: Medicare HMO | Admitting: Cardiovascular Disease

## 2022-03-13 NOTE — Progress Notes (Deleted)
Cardiology Office Note  Date:  03/13/2022   ID:  Brandi Douglas, DOB [birth year], MRN [id number]  PCP:  Crecencio Mc, MD   No chief complaint on file.   HPI:  [name] is a 71 yo woman with PMH of   chronic fatigue , managed with Vyvanse Who presents by referral from Dr. Derrel Nip for tachycardia   ,  referred  by Dr Nicolasa Ducking for evaluation of  symptomatic tachycardia noted during recent in person visit.      Patient states that she recently had an episode of rapid pulse detected by her watch (rate  114) accompanied by diaphoresis.  She  ,  iced her face,  lay down and raised legs , and pulse gradually improved but did not drop below  100 .  She denies chest pain,  dyspnea and edema.  States that her tachycardia is longstanding and precedes use of Vyvanse ; does not preclude her from regular participation in exercise.  Works out regularly at [employer].  Pulse reaches a high of 145 and then plateaus at 130 if she continues walking.  Marland Kitchen  No prior cardiac evaluation  except a nuclear medicine study in 2001 by [facility] .  Wants to see her husband's cardiologist,  Dr Rockey Situ.She is taking the 20 mg capsules and reducing the contents by 25% because feels that 15 mg is the appropriate.   Drinks 24 ounces of ice tea daily (weak,  no coffee,  no alcohol bc of gastritis /GERD )     recalls a history of beta blocker intolerance.  Trial of cardizem CD     PMH:   has a past medical history of Chronic fatigue syndrome, Generalized anxiety disorder, and Irritable bowel syndrome.  PSH:    Past Surgical History:  Procedure Laterality Date   CHOLECYSTECTOMY      Current Outpatient Medications  Medication Sig Dispense Refill   ALPRAZolam (XANAX) 0.5 MG tablet Take 0.5-1 tablets (0.25-0.5 mg total) by mouth 2 (two) times daily as needed. For anxiety 30 tablet 5   diltiazem (CARDIZEM CD) 120 MG 24 hr capsule Take 1 capsule (120 mg total) by mouth daily. 30 capsule 2   DULoxetine (CYMBALTA) 60 MG capsule Take 60  mg by mouth daily.      hyoscyamine (LEVSIN SL) 0.125 MG SL tablet Place 1 tablet (0.125 mg total) under the tongue every 4 (four) hours as needed for cramping. 30 tablet 3   lisdexamfetamine (VYVANSE) 20 MG capsule Take 1 capsule by mouth daily.      omeprazole (PRILOSEC) 40 MG capsule TAKE 1 CAPSULE BY MOUTH ONCE DAILY 90 capsule 0   ondansetron (ZOFRAN) 8 MG tablet Take 1 tablet (8 mg total) by mouth every 8 (eight) hours as needed. For nausea 20 tablet 5   SUMAtriptan (IMITREX) 100 MG tablet TAKE 1 TABLET BY MOUTH AT ONSET OF HEAD-ACHE. MAY TAKE AN ADDITIONAL TABLET IN 2 HOURS IF NEEDED. MAX 2/24. 10 tablet 3   tolterodine (DETROL LA) 4 MG 24 hr capsule TAKE 1 CAPSULE BY MOUTH ONCE DAILY 90 capsule 1   No current facility-administered medications for this visit.     Allergies:   Aspirin, Dexilant [dexlansoprazole], and Lubiprostone   Social History:  The patient  reports that she has never smoked. She has never used smokeless tobacco. She reports that she does not drink alcohol and does not use drugs.   Family History:   family history includes Hypertension (age of onset: 68)  in her father; Hypothyroidism in her mother; Testicular cancer (age of onset: 28) in her son.    Review of Systems: ROS   PHYSICAL EXAM: VS:  There were no vitals taken for this visit. , BMI There is no height or weight on file to calculate BMI. GEN: Well nourished, well developed, in no acute distress HEENT: normal Neck: no JVD, carotid bruits, or masses Cardiac: RRR; no murmurs, rubs, or gallops,no edema  Respiratory:  clear to auscultation bilaterally, normal work of breathing GI: soft, nontender, nondistended, + BS MS: no deformity or atrophy Skin: warm and dry, no rash Neuro:  Strength and sensation are intact Psych: euthymic mood, full affect    Recent Labs: 11/18/2021: ALT 16; BUN 14; Creatinine, Ser 0.87; Hemoglobin 13.5; Magnesium 1.9; Platelets 178.0; Potassium 4.3; Sodium 139; TSH 1.30     Lipid Panel Lab Results  Component Value Date   CHOL 180 01/20/2021   HDL 67.10 01/20/2021   LDLCALC 94 01/20/2021   TRIG 91.0 01/20/2021      Wt Readings from Last 3 Encounters:  11/18/21 157 lb 6.4 oz (71.4 kg)  10/25/21 155 lb (70.3 kg)  01/20/21 156 lb 3.2 oz (70.9 kg)       ASSESSMENT AND PLAN:  Problem List Items Addressed This Visit   None    Disposition:   F/U  12 months   Total encounter time more than 30 minutes  Greater than 50% was spent in counseling and coordination of care with the patient    Signed, Esmond Plants, M.D., Ph.D. [place], [place]

## 2022-03-14 ENCOUNTER — Ambulatory Visit: Payer: Medicare HMO | Admitting: Cardiovascular Disease

## 2022-03-14 ENCOUNTER — Encounter: Payer: Self-pay | Admitting: Cardiovascular Disease

## 2022-03-14 ENCOUNTER — Ambulatory Visit: Payer: Medicare HMO | Attending: Cardiovascular Disease | Admitting: Cardiovascular Disease

## 2022-03-14 VITALS — BP 106/82 | HR 87 | Ht 66.0 in | Wt 158.4 lb

## 2022-03-14 DIAGNOSIS — R000 Tachycardia, unspecified: Secondary | ICD-10-CM

## 2022-03-14 DIAGNOSIS — K589 Irritable bowel syndrome without diarrhea: Secondary | ICD-10-CM

## 2022-03-14 MED ORDER — PROPRANOLOL HCL 10 MG PO TABS: 10.0000 mg | ORAL_TABLET | Freq: Three times a day (TID) | ORAL | 0 refills | Status: DC | PRN

## 2022-03-14 NOTE — Progress Notes (Signed)
Cardiology Office Note  Date:  03/14/2022   ID:  Brandi Douglas, DOB 1950/12/26, MRN [id number]  PCP:  Brandi Mc, MD   Chief Complaint  Patient presents with   New Patient (Initial Visit)    Ref by Dr. Derrel Nip for tachycardia. Patient has a Hx: of chronic fatigue.Medications reviewed by the patient verbally.     HPI:  [name] is a 71 year old woman with past medical history of Chronic fatigue, on stimulant since 2004, managed with various meds, recently Vyvanse GAD IBS, vagal symptoms, takes aline, 20-30 years Who presents by referral from Dr. Derrel Nip for consultation of her tachycardia  On discussion today, she reports that she has a long history of chronic fatigue Has tried numerous medications dating back 20 years Most successfully has been taking Vyvanse This medication was held 6 weeks ago or more after episode of tachycardia, diaphoresis Since stopping the medication she has felt poorly with profound fatigue Reports she has slept for the past 2 to 3 days straight, difficulty staying awake   On discussion of recent events, reports not having much difficulty on Vyvanse over the past 4 years She has had waxing waning GI issues with associated vasovagal symptoms, loose bowel movements, episodes of periodic diaphoresis, difficulty with gastric emptying, chronic nausea  Had episode of tachycardia, diaphoresis approximately 3 months ago May have had GI trigger, vasovagal, IBS flareup Home noticed elevated heart rate with some diaphoresis, had to lay down, legs raised, put cold towel on her head Heart rate slowly improved without intervention  Episode 2 months ago of feeling lightheaded after walking up some steps  Etiology unclear, mild diaphoresis Unclear if she was having GI issues on that day  history of beta blocker intolerance.  Trial of cardizem CD , stopped secondary to side effects  2-3 x a week gets vagal response related to GI issues  EKG May 2023 sinus  tachycardia, concern for LVH by voltage, diffuse ST abnormality, possible repolarization abnormality  Normal sinus rhythm rate 87 bpm no significant ST-T wave changes  PMH:   has a past medical history of Chronic fatigue syndrome, Generalized anxiety disorder, and Irritable bowel syndrome.  PSH:    Past Surgical History:  Procedure Laterality Date   CHOLECYSTECTOMY      Current Outpatient Medications  Medication Sig Dispense Refill   ALPRAZolam (XANAX) 0.5 MG tablet Take 0.5-1 tablets (0.25-0.5 mg total) by mouth 2 (two) times daily as needed. For anxiety 30 tablet 5   DULoxetine (CYMBALTA) 60 MG capsule Take 60 mg by mouth daily.      hyoscyamine (LEVSIN SL) 0.125 MG SL tablet Place 1 tablet (0.125 mg total) under the tongue every 4 (four) hours as needed for cramping. 30 tablet 3   omeprazole (PRILOSEC) 40 MG capsule TAKE 1 CAPSULE BY MOUTH ONCE DAILY 90 capsule 0   ondansetron (ZOFRAN) 8 MG tablet Take 1 tablet (8 mg total) by mouth every 8 (eight) hours as needed. For nausea 20 tablet 5   propranolol (INDERAL) 10 MG tablet Take 1 tablet (10 mg total) by mouth 3 (three) times daily as needed. 90 tablet 0   SUMAtriptan (IMITREX) 100 MG tablet TAKE 1 TABLET BY MOUTH AT ONSET OF HEAD-ACHE. MAY TAKE AN ADDITIONAL TABLET IN 2 HOURS IF NEEDED. MAX 2/24. 10 tablet 3   tolterodine (DETROL LA) 4 MG 24 hr capsule TAKE 1 CAPSULE BY MOUTH ONCE DAILY 90 capsule 1   lisdexamfetamine (VYVANSE) 20 MG capsule Take 1 capsule  by mouth daily.  (Patient not taking: Reported on 03/14/2022)     No current facility-administered medications for this visit.     Allergies:   Aspirin, Dexilant [dexlansoprazole], and Lubiprostone   Social History:  The patient  reports that she has never smoked. She has never used smokeless tobacco. She reports that she does not drink alcohol and does not use drugs.   Family History:   family history includes Hypertension (age of onset: 59) in her father; Hypothyroidism in her  mother; Testicular cancer (age of onset: 52) in her son.    Review of Systems: Review of Systems  Constitutional: Negative.   HENT: Negative.    Respiratory: Negative.    Cardiovascular: Negative.   Gastrointestinal: Negative.   Musculoskeletal: Negative.   Neurological:  Positive for dizziness.  Psychiatric/Behavioral: Negative.    All other systems reviewed and are negative.    PHYSICAL EXAM: VS:  BP 106/82 (BP Location: Right Arm, Patient Position: Sitting, Cuff Size: Normal)   Pulse 87   Ht '5\' 6"'$  (1.676 m)   Wt 158 lb 6 oz (71.8 kg)   SpO2 99%   BMI 25.56 kg/m  , BMI Body mass index is 25.56 kg/m. GEN: Well nourished, well developed, in no acute distress HEENT: normal Neck: no JVD, carotid bruits, or masses Cardiac: RRR; no murmurs, rubs, or gallops,no edema  Respiratory:  clear to auscultation bilaterally, normal work of breathing GI: soft, nontender, nondistended, + BS MS: no deformity or atrophy Skin: warm and dry, no rash Neuro:  Strength and sensation are intact Psych: euthymic mood, full affect   Recent Labs: 11/18/2021: ALT 16; BUN 14; Creatinine, Ser 0.87; Hemoglobin 13.5; Magnesium 1.9; Platelets 178.0; Potassium 4.3; Sodium 139; TSH 1.30    Lipid Panel Lab Results  Component Value Date   CHOL 180 01/20/2021   HDL 67.10 01/20/2021   LDLCALC 94 01/20/2021   TRIG 91.0 01/20/2021      Wt Readings from Last 3 Encounters:  03/14/22 158 lb 6 oz (71.8 kg)  11/18/21 157 lb 6.4 oz (71.4 kg)  10/25/21 155 lb (70.3 kg)       ASSESSMENT AND PLAN:  Problem List Items Addressed This Visit     Irritable bowel syndrome (IBS)   Relevant Orders   EKG 12-Lead   Other Visit Diagnoses     Sinus tachycardia    -  Primary   Relevant Orders   EKG 12-Lead   ECHOCARDIOGRAM COMPLETE      Vasovagal spells Often associated with her IBS symptoms IBS symptoms presenting on a regular basis, several times per month Often with abdominal discomfort leading to  loose bowel movements, diaphoresis, near syncope She does have medications for nausea Precautions discussed such as staying hydrated, laying flat when she has near syncope symptoms.  Husband aware and can assist  Sinus tachycardia Has been on Vyvanse for the past 4 years, reports having rare recent episodes requiring her to lay down secondary to diaphoresis, orthostasis symptoms, tachycardic.  Unable to exclude GI etiology as a trigger as this seems to happen on a regular basis.  We have recommended echocardiogram.  Discussed going back on Vyvanse as she has been incapacitated without the medication.  Suggested she have on hand propranolol 10 up to 20 mg to take as needed for episodes of tachycardia not associated with GI etiology For frequent episodes could try second-generation beta-blocker such as bystolic typically associated with limited side effects -No indication for stress testing or ischemic work-up  at this time    Total encounter time more than 60 minutes  Greater than 50% was spent in counseling and coordination of care with the patient    Signed, Esmond Plants, M.D., Ph.D. [place], [place]

## 2022-03-14 NOTE — Patient Instructions (Addendum)
Medication Instructions:   START Propranolol - Take one tablet ('10mg'$ ) by mouth three times a day as needed for palpitations.    If you need a refill on your cardiac medications before your next appointment, please call your pharmacy.   Lab work: No new labs needed  Testing/Procedures:  Your physician has requested that you have an echocardiogram. Echocardiography is a painless test that uses sound waves to create images of your heart. It provides your doctor with information about the size and shape of your heart and how well your heart's chambers and valves are working. This procedure takes approximately one hour. There are no restrictions for this procedure. Please note; depending on visual quality an IV may need to be placed.    Follow-Up: At [facility], you and your health needs are our priority.  As part of our continuing mission to provide you with exceptional heart care, we have created designated Provider Care Teams.  These Care Teams include your primary Cardiologist (physician) and Advanced Practice Providers (APPs -  Physician Assistants and Nurse Practitioners) who all work together to provide you with the care you need, when you need it.  You will need a follow up appointment as needed  Providers on your designated Care Team:   Murray Hodgkins, NP Christell Faith, PA-C Cadence Kathlen Mody, [place]  COVID-19 Vaccine Information can be found at: [email or web address] For questions related to vaccine distribution or appointments, please email [email or web address] or call [phone].

## 2022-03-18 ENCOUNTER — Other Ambulatory Visit: Payer: Self-pay | Admitting: Internal Medicine

## 2022-03-23 ENCOUNTER — Encounter: Payer: Medicare HMO | Admitting: Internal Medicine

## 2022-04-18 ENCOUNTER — Other Ambulatory Visit: Payer: Self-pay | Admitting: Internal Medicine

## 2022-04-20 ENCOUNTER — Ambulatory Visit: Payer: Medicare HMO

## 2022-05-10 ENCOUNTER — Ambulatory Visit: Payer: Medicare HMO

## 2022-06-14 NOTE — Telephone Encounter (Signed)
Error

## 2022-08-01 ENCOUNTER — Other Ambulatory Visit: Payer: Self-pay | Admitting: Internal Medicine

## 2022-08-03 ENCOUNTER — Ambulatory Visit: Payer: Medicare HMO | Attending: Cardiovascular Disease

## 2022-08-03 DIAGNOSIS — R000 Tachycardia, unspecified: Secondary | ICD-10-CM

## 2022-08-03 DIAGNOSIS — I4711 Inappropriate sinus tachycardia, so stated: Secondary | ICD-10-CM

## 2022-08-03 LAB — ECHOCARDIOGRAM COMPLETE
AR max vel: 2.62 cm2
AV Area VTI: 2.7 cm2
AV Area mean vel: 2.72 cm2
AV Mean grad: 3 mmHg
AV Peak grad: 5.6 mmHg
Ao pk vel: 1.18 m/s
Area-P 1/2: 3.99 cm2
Calc EF: 52.1 %
S' Lateral: 3 cm
Single Plane A2C EF: 51.6 %
Single Plane A4C EF: 52.7 %

## 2022-08-08 ENCOUNTER — Telehealth: Payer: Self-pay | Admitting: Cardiovascular Disease

## 2022-08-08 NOTE — Telephone Encounter (Signed)
Pt made aware of ECHO results and verbalized understanding.   Low normal ejection fraction, EF estimated 50 to 55% Normal RV size and function No significant valvular disease Overall looks relatively good

## 2022-08-08 NOTE — Telephone Encounter (Signed)
Patient is returning call to discuss echo results. °

## 2022-10-06 ENCOUNTER — Encounter: Payer: Self-pay | Admitting: Internal Medicine

## 2022-10-24 ENCOUNTER — Telehealth: Payer: Self-pay | Admitting: Internal Medicine

## 2022-10-24 NOTE — Telephone Encounter (Signed)
Contacted Eloy End to schedule their annual wellness visit. Appointment made for 10/31/2022.  Thank you,  [facility] Support [facility] Direct dial  [phone]

## 2022-10-28 ENCOUNTER — Other Ambulatory Visit: Payer: Self-pay | Admitting: Internal Medicine

## 2022-10-31 ENCOUNTER — Ambulatory Visit (INDEPENDENT_AMBULATORY_CARE_PROVIDER_SITE_OTHER): Payer: Medicare HMO

## 2022-10-31 VITALS — Ht 66.0 in | Wt 154.0 lb

## 2022-10-31 DIAGNOSIS — Z0000 Encounter for general adult medical examination without abnormal findings: Secondary | ICD-10-CM

## 2022-10-31 NOTE — Patient Instructions (Signed)
Brandi Douglas , Thank you for taking time to come for your Medicare Wellness Visit. I appreciate your ongoing commitment to your health goals. Please review the following plan we discussed and let me know if I can assist you in the future.   These are the goals we discussed:  Goals       Patient Stated     Maintain Healthy Lifestyle (pt-stated)      Stay active Healthy diet Stay hydrated        This is a list of the screening recommended for you and due dates:  Health Maintenance  Topic Date Due   COVID-19 Vaccine (6 - 2023-24 season) 11/16/2022*   Zoster (Shingles) Vaccine (1 of 2) 01/30/2023*   Pneumonia Vaccine (1 of 1 - PCV) 10/31/2023*   DTaP/Tdap/Td vaccine (2 - Td or Tdap) 01/19/2023   Flu Shot  02/02/2023   Mammogram  10/31/2023   Medicare Annual Wellness Visit  10/31/2023   Colon Cancer Screening  03/19/2024   DEXA scan (bone density measurement)  Completed   Hepatitis C Screening: USPSTF Recommendation to screen - Ages 79-79 yo.  Completed   HPV Vaccine  Aged Out  *Topic was postponed. The date shown is not the original due date.    Advanced directives: End of life planning; Advance aging; Advanced directives discussed.  Copy of current HCPOA/Living Will requested.    Conditions/risks identified: none new  Next appointment: Follow up in one year for your annual wellness visit    Preventive Care 65 Years and Older, Female Preventive care refers to lifestyle choices and visits with your health care provider that can promote health and wellness. What does preventive care include? A yearly physical exam. This is also called an annual well check. Dental exams once or twice a year. Routine eye exams. Ask your health care provider how often you should have your eyes checked. Personal lifestyle choices, including: Daily care of your teeth and gums. Regular physical activity. Eating a healthy diet. Avoiding tobacco and drug use. Limiting alcohol use. Practicing safe  sex. Taking low-dose aspirin every day. Taking vitamin and mineral supplements as recommended by your health care provider. What happens during an annual well check? The services and screenings done by your health care provider during your annual well check will depend on your age, overall health, lifestyle risk factors, and family history of disease. Counseling  Your health care provider may ask you questions about your: Alcohol use. Tobacco use. Drug use. Emotional well-being. Home and relationship well-being. Sexual activity. Eating habits. History of falls. Memory and ability to understand (cognition). Work and work Astronomer. Reproductive health. Screening  You may have the following tests or measurements: Height, weight, and BMI. Blood pressure. Lipid and cholesterol levels. These may be checked every 5 years, or more frequently if you are over 105 years old. Skin check. Lung cancer screening. You may have this screening every year starting at age 52 if you have a 30-pack-year history of smoking and currently smoke or have quit within the past 15 years. Fecal occult blood test (FOBT) of the stool. You may have this test every year starting at age 31. Flexible sigmoidoscopy or colonoscopy. You may have a sigmoidoscopy every 5 years or a colonoscopy every 10 years starting at age 70. Hepatitis C blood test. Hepatitis B blood test. Sexually transmitted disease (STD) testing. Diabetes screening. This is done by checking your blood sugar (glucose) after you have not eaten for a while (fasting). You may  have this done every 1-3 years. Bone density scan. This is done to screen for osteoporosis. You may have this done starting at age 69. Mammogram. This may be done every 1-2 years. Talk to your health care provider about how often you should have regular mammograms. Talk with your health care provider about your test results, treatment options, and if necessary, the need for more  tests. Vaccines  Your health care provider may recommend certain vaccines, such as: Influenza vaccine. This is recommended every year. Tetanus, diphtheria, and acellular pertussis (Tdap, Td) vaccine. You may need a Td booster every 10 years. Zoster vaccine. You may need this after age 63. Pneumococcal 13-valent conjugate (PCV13) vaccine. One dose is recommended after age 12. Pneumococcal polysaccharide (PPSV23) vaccine. One dose is recommended after age 50. Talk to your health care provider about which screenings and vaccines you need and how often you need them. This information is not intended to replace advice given to you by your health care provider. Make sure you discuss any questions you have with your health care provider. Document Released: 07/17/2015 Document Revised: 03/09/2016 Document Reviewed: 04/21/2015 Elsevier Interactive Patient Education  2017 [place] Prevention in the Home Falls can cause injuries. They can happen to people of all ages. There are many things you can do to make your home safe and to help prevent falls. What can I do on the outside of my home? Regularly fix the edges of walkways and driveways and fix any cracks. Remove anything that might make you trip as you walk through a door, such as a raised step or threshold. Trim any bushes or trees on the path to your home. Use bright outdoor lighting. Clear any walking paths of anything that might make someone trip, such as rocks or tools. Regularly check to see if handrails are loose or broken. Make sure that both sides of any steps have handrails. Any raised decks and porches should have guardrails on the edges. Have any leaves, snow, or ice cleared regularly. Use sand or salt on walking paths during winter. Clean up any spills in your garage right away. This includes oil or grease spills. What can I do in the bathroom? Use night lights. Install grab bars by the toilet and in the tub and shower.  Do not use towel bars as grab bars. Use non-skid mats or decals in the tub or shower. If you need to sit down in the shower, use a plastic, non-slip stool. Keep the floor dry. Clean up any water that spills on the floor as soon as it happens. Remove soap buildup in the tub or shower regularly. Attach bath mats securely with double-sided non-slip rug tape. Do not have throw rugs and other things on the floor that can make you trip. What can I do in the bedroom? Use night lights. Make sure that you have a light by your bed that is easy to reach. Do not use any sheets or blankets that are too big for your bed. They should not hang down onto the floor. Have a firm chair that has side arms. You can use this for support while you get dressed. Do not have throw rugs and other things on the floor that can make you trip. What can I do in the kitchen? Clean up any spills right away. Avoid walking on wet floors. Keep items that you use a lot in easy-to-reach places. If you need to reach something above you, use a strong step  stool that has a grab bar. Keep electrical cords out of the way. Do not use floor polish or wax that makes floors slippery. If you must use wax, use non-skid floor wax. Do not have throw rugs and other things on the floor that can make you trip. What can I do with my stairs? Do not leave any items on the stairs. Make sure that there are handrails on both sides of the stairs and use them. Fix handrails that are broken or loose. Make sure that handrails are as long as the stairways. Check any carpeting to make sure that it is firmly attached to the stairs. Fix any carpet that is loose or worn. Avoid having throw rugs at the top or bottom of the stairs. If you do have throw rugs, attach them to the floor with carpet tape. Make sure that you have a light switch at the top of the stairs and the bottom of the stairs. If you do not have them, ask someone to add them for you. What else  can I do to help prevent falls? Wear shoes that: Do not have high heels. Have rubber bottoms. Are comfortable and fit you well. Are closed at the toe. Do not wear sandals. If you use a stepladder: Make sure that it is fully opened. Do not climb a closed stepladder. Make sure that both sides of the stepladder are locked into place. Ask someone to hold it for you, if possible. Clearly mark and make sure that you can see: Any grab bars or handrails. First and last steps. Where the edge of each step is. Use tools that help you move around (mobility aids) if they are needed. These include: Canes. Walkers. Scooters. Crutches. Turn on the lights when you go into a dark area. Replace any light bulbs as soon as they burn out. Set up your furniture so you have a clear path. Avoid moving your furniture around. If any of your floors are uneven, fix them. If there are any pets around you, be aware of where they are. Review your medicines with your doctor. Some medicines can make you feel dizzy. This can increase your chance of falling. Ask your doctor what other things that you can do to help prevent falls. This information is not intended to replace advice given to you by your health care provider. Make sure you discuss any questions you have with your health care provider. Document Released: 04/16/2009 Document Revised: 11/26/2015 Document Reviewed: 07/25/2014 Elsevier Interactive Patient Education  2017 [employer].

## 2022-10-31 NOTE — Progress Notes (Addendum)
Subjective:   Brandi Douglas is a 72 y.o. female who presents for Medicare Annual (Subsequent) preventive examination.  Review of Systems    No ROS.  Medicare Wellness Virtual Visit.  Visual/audio telehealth visit, UTA vital signs.   See social history for additional risk factors.   Cardiac Risk Factors include: advanced age (>26men, >8 women)     Objective:    Today's Vitals   10/31/22 1140  Weight: 154 lb (69.9 kg)  Height: 5\' 6"  (1.676 m)   Body mass index is 24.86 kg/m.     10/31/2022   11:41 AM 10/25/2021    3:19 PM 06/17/2020    1:42 PM 03/20/2019   10:50 AM 04/26/2018    7:00 PM 04/26/2018   12:21 PM  Advanced Directives  Does Patient Have a Medical Advance Directive? Yes Yes Yes Yes Yes No  Type of Estate agent of [place];Living will Healthcare Power of [place];Living will Healthcare Power of [place];Living will Healthcare Power of [place];Living will Healthcare Power of Attorney   Does patient want to make changes to medical advance directive? No - Patient declined No - Patient declined No - Patient declined No - Patient declined No - Patient declined   Copy of Healthcare Power of Attorney in Chart? No - copy requested No - copy requested No - copy requested No - copy requested No - copy requested   Would patient like information on creating a medical advance directive?     No - Patient declined No - Patient declined    Current Medications (verified) Outpatient Encounter Medications as of 10/31/2022  Medication Sig   ALPRAZolam (XANAX) 0.5 MG tablet Take 0.5-1 tablets (0.25-0.5 mg total) by mouth 2 (two) times daily as needed. For anxiety   DULoxetine (CYMBALTA) 60 MG capsule Take 60 mg by mouth daily.    hyoscyamine (LEVSIN SL) 0.125 MG SL tablet Place 1 tablet (0.125 mg total) under the tongue every 4 (four) hours as needed for cramping.   lisdexamfetamine (VYVANSE) 20 MG capsule Take 1 capsule by mouth daily.  (Patient not taking:  Reported on 03/14/2022)   omeprazole (PRILOSEC) 40 MG capsule Take 1 capsule (40 mg total) by mouth daily. MUST KEEP SCHEDULED APPT 11/21/22 FOR 90 DAY SUPPLY   ondansetron (ZOFRAN) 8 MG tablet Take 1 tablet (8 mg total) by mouth every 8 (eight) hours as needed. For nausea   propranolol (INDERAL) 10 MG tablet Take 1 tablet (10 mg total) by mouth 3 (three) times daily as needed.   SUMAtriptan (IMITREX) 100 MG tablet TAKE 1 TABLET BY MOUTH AT ONSET OF HEAD-ACHE. MAY TAKE AN ADDITIONAL TABLET IN 2 HOURS IF NEEDED. MAX 2/24.   tolterodine (DETROL LA) 4 MG 24 hr capsule TAKE 1 CAPSULE BY MOUTH ONCE DAILY   No facility-administered encounter medications on file as of 10/31/2022.    Allergies (verified) Aspirin, Dexilant [dexlansoprazole], and Lubiprostone   History: Past Medical History:  Diagnosis Date   Chronic fatigue syndrome    Generalized anxiety disorder    Irritable bowel syndrome    Past Surgical History:  Procedure Laterality Date   CHOLECYSTECTOMY     Family History  Problem Relation Age of Onset   Hypothyroidism Mother    Hypertension Father 64       died in 2023-03-05 of complications during pacemaker insertion    Testicular cancer Son 50   Social History   Socioeconomic History   Marital status: Married    Spouse name: Renae Fickle  Number of children: Not on file   Years of education: Not on file   Highest education level: Not on file  Occupational History   Occupation: Armed forces operational officer    Employer: [employer]  Tobacco Use   Smoking status: Never   Smokeless tobacco: Never  Vaping Use   Vaping Use: Never used  Substance and Sexual Activity   Alcohol use: No   Drug use: No   Sexual activity: Yes    Partners: Male  Other Topics Concern   Not on file  Social History Narrative   Married    Social Determinants of Health   Financial Resource Strain: Low Risk  (10/31/2022)   Overall Financial Resource Strain (CARDIA)    Difficulty of Paying Living Expenses:  Not hard at all  Food Insecurity: No Food Insecurity (10/31/2022)   Hunger Vital Sign    Worried About Running Out of Food in the Last Year: Never true    Ran Out of Food in the Last Year: Never true  Transportation Needs: No Transportation Needs (10/31/2022)   PRAPARE - Administrator, Civil Service (Medical): No    Lack of Transportation (Non-Medical): No  Physical Activity: Sufficiently Active (10/31/2022)   Exercise Vital Sign    Days of Exercise per Week: 5 days    Minutes of Exercise per Session: 30 min  Stress: No Stress Concern Present (10/31/2022)   [employer] of Occupational Health - Occupational Stress Questionnaire    Feeling of Stress : Not at all  Social Connections: Unknown (10/31/2022)   Social Connection and Isolation Panel [NHANES]    Frequency of Communication with Friends and Family: More than three times a week    Frequency of Social Gatherings with Friends and Family: More than three times a week    Attends Religious Services: Not on Marketing executive or Organizations: Not on file    Attends Banker Meetings: Not on file    Marital Status: Not on file    Tobacco Counseling Counseling given: Not Answered   Clinical Intake:                          Activities of Daily Living    10/31/2022   11:43 AM  In your present state of health, do you have any difficulty performing the following activities:  Hearing? 0  Vision? 0  Difficulty concentrating or making decisions? 0  Walking or climbing stairs? 0  Dressing or bathing? 0  Doing errands, shopping? 0  Preparing Food and eating ? N  Using the Toilet? N  In the past six months, have you accidently leaked urine? N  Do you have problems with loss of bowel control? N  Managing your Medications? N  Managing your Finances? N  Housekeeping or managing your Housekeeping? N    Patient Care Team: Sherlene Shams, MD as PCP - General (Internal  Medicine)  Indicate any recent Medical Services you may have received from other than [facility] providers in the past year (date may be approximate).     Assessment:   This is a routine wellness examination for Brandi Douglas.  I connected with  Eloy End on 10/31/22 by a audio enabled telemedicine application and verified that I am speaking with the correct person using two identifiers.  Patient Location: Home  Provider Location: Office/Clinic  I discussed the limitations of evaluation and management by telemedicine.  The patient expressed understanding and agreed to proceed.   Hearing/Vision screen Hearing Screening - Comments:: Patient is able to hear conversational tones without difficulty.  No issues reported.  Vision Screening - Comments:: Followed by [facility] [place] Wears corrective lenses They have seen their ophthalmologist in the last 12 months.    Dietary issues and exercise activities discussed: Current Exercise Habits: Home exercise routine, Type of exercise: walking, Time (Minutes): 30, Frequency (Times/Week): 5, Weekly Exercise (Minutes/Week): 150, Intensity: Mild Healthy diet Good water intake   Goals Addressed               This Visit's Progress     Patient Stated     Maintain Healthy Lifestyle (pt-stated)        Stay active Healthy diet Stay hydrated       Depression Screen    10/31/2022   11:42 AM 11/18/2021   12:12 PM 10/25/2021    3:20 PM 01/20/2021    2:07 PM 06/17/2020    1:43 PM 03/20/2019   10:47 AM 04/24/2018    2:46 PM  PHQ 2/9 Scores  PHQ - 2 Score 0 0 0 0 0 0 0    Fall Risk    10/31/2022   11:42 AM 11/18/2021   12:12 PM 10/25/2021    3:20 PM 01/20/2021    2:07 PM 06/17/2020    1:43 PM  Fall Risk   Falls in the past year? 0 0 0 1 0  Number falls in past yr: 0  0 0 0  Injury with Fall? 0   0   Risk for fall due to :  No Fall Risks  History of fall(s)   Follow up Falls evaluation completed;Falls prevention discussed Falls  evaluation completed Falls evaluation completed Falls evaluation completed Falls evaluation completed    FALL RISK PREVENTION PERTAINING TO THE HOME: Home free of loose throw rugs in walkways, pet beds, electrical cords, etc? Yes  Adequate lighting in your home to reduce risk of falls? Yes   ASSISTIVE DEVICES UTILIZED TO PREVENT FALLS: Life alert? No  Use of a cane, [place] or w/c? No  Grab bars in the bathroom? No  Shower chair or bench in shower? No  Elevated toilet seat or a handicapped toilet? No   TIMED UP AND GO: Was the test performed? No .   Cognitive Function:        10/31/2022   11:48 AM 03/20/2019   11:00 AM  6CIT Screen  What Year? 0 points 0 points  What month? 0 points 0 points  What time? 0 points 0 points  Count back from 20 0 points 0 points  Months in reverse 0 points 0 points  Repeat phrase 0 points 0 points  Total Score 0 points 0 points    Immunizations Immunization History  Administered Date(s) Administered   Influenza Split 03/13/2014   Influenza, High Dose Seasonal PF 05/07/2019   Influenza,inj,Quad PF,6+ Mos 05/06/2015, 05/17/2016   Influenza-Unspecified 03/04/2012, 05/03/2013, 04/19/2017, 04/19/2018   Moderna Sars-Covid-2 Vaccination 07/25/2019, 08/28/2019, 07/19/2020, 06/14/2021   PPD Test 02/05/2014   Pfizer Covid-19 Vaccine Bivalent Booster 5y-11y 05/07/2022   Tdap 01/18/2013   Zoster, Live 04/04/2011   Covid-19 vaccine status: Completed vaccines x5.  Shingrix Completed?: No.    Education has been provided regarding the importance of this vaccine. Patient has been advised to call insurance company to determine out of pocket expense if they have not yet received this vaccine. Advised may also receive vaccine  at local pharmacy or Health Dept. Verbalized acceptance and understanding.  Screening Tests Health Maintenance  Topic Date Due   COVID-19 Vaccine (6 - 2023-24 season) 11/16/2022 (Originally 07/02/2022)   Zoster Vaccines- Shingrix (1  of 2) 01/30/2023 (Originally 06/12/1970)   Pneumonia Vaccine 25+ Years old (1 of 1 - PCV) 10/31/2023 (Originally 06/11/2017)   DTaP/Tdap/Td (2 - Td or Tdap) 01/19/2023   INFLUENZA VACCINE  02/02/2023   MAMMOGRAM  10/31/2023   Medicare Annual Wellness (AWV)  10/31/2023   COLONOSCOPY (Pts 45-74yrs Insurance coverage will need to be confirmed)  03/19/2024   DEXA SCAN  Completed   Hepatitis C Screening  Completed   HPV VACCINES  Aged Out    Health Maintenance  There are no preventive care reminders to display for this patient.  Mammogram- due [facility]. Agrees to schedule.  Lung Cancer Screening: (Low Dose CT Chest recommended if Age 41-80 years, 30 pack-year currently smoking OR have quit w/in 15years.) does not qualify.   Hepatitis C Screening: Completed 02/2015.  Vision Screening: Recommended annual ophthalmology exams for early detection of glaucoma and other disorders of the eye.  Dental Screening: Recommended annual dental exams for proper oral hygiene  Community Resource Referral / Chronic Care Management: CRR required this visit?  No   CCM required this visit?  No      Plan:     I have personally reviewed and noted the following in the patient's chart:   Medical and social history Use of alcohol, tobacco or illicit drugs  Current medications and supplements including opioid prescriptions. Patient is not currently taking opioid prescriptions. Functional ability and status Nutritional status Physical activity Advanced directives List of other physicians Hospitalizations, surgeries, and ER visits in previous 12 months Vitals Screenings to include cognitive, depression, and falls Referrals and appointments  In addition, I have reviewed and discussed with patient certain preventive protocols, quality metrics, and best practice recommendations. A written personalized care plan for preventive services as well as general preventive health recommendations were  provided to patient.     Kiandre Spagnolo L Motley, LPN   4/69/6295     I have reviewed the above information and agree with above.   Duncan Dull, MD

## 2022-11-16 ENCOUNTER — Telehealth: Payer: Self-pay | Admitting: Internal Medicine

## 2022-11-16 ENCOUNTER — Encounter: Payer: Self-pay | Admitting: Internal Medicine

## 2022-11-16 DIAGNOSIS — E785 Hyperlipidemia, unspecified: Secondary | ICD-10-CM

## 2022-11-16 DIAGNOSIS — R7303 Prediabetes: Secondary | ICD-10-CM

## 2022-11-16 DIAGNOSIS — G9332 Myalgic encephalomyelitis/chronic fatigue syndrome: Secondary | ICD-10-CM

## 2022-11-16 LAB — HM MAMMOGRAPHY

## 2022-11-16 NOTE — Telephone Encounter (Signed)
Pt called because she would like to know if provider needs blood work before her appt on Monday 5/20? Pt would like a call back or a message through MyChart.

## 2022-11-16 NOTE — Telephone Encounter (Signed)
I have ordered labs for pt to have done before her appt on 11/21/2022. Is there anything else that needs to be ordered?

## 2022-11-21 ENCOUNTER — Encounter: Payer: Self-pay | Admitting: Internal Medicine

## 2022-11-21 ENCOUNTER — Ambulatory Visit (INDEPENDENT_AMBULATORY_CARE_PROVIDER_SITE_OTHER): Payer: Medicare HMO | Admitting: Internal Medicine

## 2022-11-21 VITALS — BP 122/60 | HR 92 | Temp 98.0°F | Ht 66.0 in | Wt 156.6 lb

## 2022-11-21 DIAGNOSIS — Z0000 Encounter for general adult medical examination without abnormal findings: Secondary | ICD-10-CM | POA: Diagnosis not present

## 2022-11-21 DIAGNOSIS — F411 Generalized anxiety disorder: Secondary | ICD-10-CM

## 2022-11-21 DIAGNOSIS — G9332 Myalgic encephalomyelitis/chronic fatigue syndrome: Secondary | ICD-10-CM | POA: Diagnosis not present

## 2022-11-21 DIAGNOSIS — R7303 Prediabetes: Secondary | ICD-10-CM

## 2022-11-21 DIAGNOSIS — E785 Hyperlipidemia, unspecified: Secondary | ICD-10-CM

## 2022-11-21 DIAGNOSIS — K582 Mixed irritable bowel syndrome: Secondary | ICD-10-CM

## 2022-11-21 DIAGNOSIS — R000 Tachycardia, unspecified: Secondary | ICD-10-CM

## 2022-11-21 LAB — COMPREHENSIVE METABOLIC PANEL
ALT: 11 U/L (ref 0–35)
AST: 18 U/L (ref 0–37)
Albumin: 4.3 g/dL (ref 3.5–5.2)
Alkaline Phosphatase: 84 U/L (ref 39–117)
BUN: 14 mg/dL (ref 6–23)
CO2: 27 mEq/L (ref 19–32)
Calcium: 9.6 mg/dL (ref 8.4–10.5)
Chloride: 104 mEq/L (ref 96–112)
Creatinine, Ser: 0.86 mg/dL (ref 0.40–1.20)
GFR: 68.43 mL/min (ref >60.00)
Glucose, Bld: 88 mg/dL (ref 70–99)
Potassium: 4.2 mEq/L (ref 3.5–5.1)
Sodium: 139 mEq/L (ref 135–145)
Total Bilirubin: 0.4 mg/dL (ref 0.2–1.2)
Total Protein: 6.7 g/dL (ref 6.0–8.3)

## 2022-11-21 LAB — CBC WITH DIFFERENTIAL/PLATELET
Basophils Absolute: 0 10*3/uL (ref 0.0–0.1)
Basophils Relative: 0.3 % (ref 0.0–3.0)
Eosinophils Absolute: 0 10*3/uL (ref 0.0–0.7)
Eosinophils Relative: 0.5 % (ref 0.0–5.0)
HCT: 37.3 % (ref 36.0–46.0)
Hemoglobin: 13.1 g/dL (ref 12.0–15.0)
Lymphocytes Relative: 28.7 % (ref 12.0–46.0)
Lymphs Abs: 1.6 10*3/uL (ref 0.7–4.0)
MCHC: 35.1 g/dL (ref 30.0–36.0)
MCV: 92.2 fl (ref 78.0–100.0)
Monocytes Absolute: 0.4 10*3/uL (ref 0.1–1.0)
Monocytes Relative: 7.1 % (ref 3.0–12.0)
Neutro Abs: 3.5 10*3/uL (ref 1.4–7.7)
Neutrophils Relative %: 63.4 % (ref 43.0–77.0)
Platelets: 186 10*3/uL (ref 150.0–400.0)
RBC: 4.04 Mil/uL (ref 3.87–5.11)
RDW: 12.3 % (ref 11.5–15.5)
WBC: 5.5 10*3/uL (ref 4.0–10.5)

## 2022-11-21 LAB — HEMOGLOBIN A1C: Hgb A1c MFr Bld: 5.1 % (ref 4.6–6.5)

## 2022-11-21 LAB — LIPID PANEL
Cholesterol: 172 mg/dL (ref 0–200)
HDL: 64.7 mg/dL (ref >39.00)
LDL Cholesterol: 90 mg/dL (ref 0–99)
NonHDL: 106.94
Total CHOL/HDL Ratio: 3
Triglycerides: 86 mg/dL (ref 0.0–149.0)
VLDL: 17.2 mg/dL (ref 0.0–40.0)

## 2022-11-21 LAB — LDL CHOLESTEROL, DIRECT: Direct LDL: 96 mg/dL

## 2022-11-21 MED ORDER — SOLIFENACIN SUCCINATE 10 MG PO TABS: 10.0000 mg | ORAL_TABLET | Freq: Every day | ORAL | 1 refills | Status: DC

## 2022-11-21 MED ORDER — OMEPRAZOLE 40 MG PO CPDR: 40.0000 mg | DELAYED_RELEASE_CAPSULE | Freq: Every day | ORAL | 3 refills | Status: AC

## 2022-11-21 MED ORDER — ONDANSETRON HCL 8 MG PO TABS: 8.0000 mg | ORAL_TABLET | Freq: Three times a day (TID) | ORAL | 5 refills | Status: DC | PRN

## 2022-11-21 NOTE — Assessment & Plan Note (Signed)
Continue Vyvanse prescribed by Dr. Maryruth Bun  and exercise.

## 2022-11-21 NOTE — Assessment & Plan Note (Signed)
Chronic.  Screening labs normal.   Cardiology evaluation  done.  Prn inderal ordered  but she limites use because she  recalls a history of beta blocker intolerance.

## 2022-11-21 NOTE — Assessment & Plan Note (Signed)
Managed by Dr Maryruth Bun with  cymbalta.  Improved insomnia noted by patient as long as she takes it by noon

## 2022-11-21 NOTE — Progress Notes (Signed)
Patient ID: Brandi Douglas, female    DOB: 1951/06/27  Age: 72 y.o. MRN: [id number]  The patient is here for annual Medicare wellness examination and management of other chronic and acute problems.   The risk factors are reflected in the social history.  The roster of all physicians providing medical care to patient - is listed in the Snapshot section of the chart.  Activities of daily living:  The patient is 100% independent in all ADLs: dressing, toileting, feeding as well as independent mobility  Home safety : The patient has smoke detectors in the home. They wear seatbelts.  There are no firearms at home. There is no violence in the home.   There is no risks for hepatitis, STDs or HIV. There is no   history of blood transfusion. They have no travel history to infectious disease endemic areas of the world.  The patient has seen their dentist in the last six month. They have seen their eye doctor in the last year. They admit to slight hearing difficulty with regard to whispered voices and some television programs.  They have deferred audiologic testing in the last year.  They do not  have excessive sun exposure. Discussed the need for sun protection: hats, long sleeves and use of sunscreen if there is significant sun exposure.   Diet: the importance of a healthy diet is discussed. They do have a healthy diet.  The benefits of regular aerobic exercise were discussed. She walks 4 times per week ,  20 minutes.   Depression screen: there are no signs or vegative symptoms of depression- irritability, change in appetite, anhedonia, sadness/tearfullness.  Cognitive assessment: the patient manages all their financial and personal affairs and is actively engaged. They could relate day,date,year and events; recalled 2/3 objects at 3 minutes; performed clock-face test normally.  The following portions of the patient's history were reviewed and updated as appropriate: allergies, current medications, past  family history, past medical history,  past surgical history, past social history  and problem list.  Visual acuity was not assessed per patient preference since she has regular follow up with her ophthalmologist. Hearing and body mass index were assessed and reviewed.   During the course of the visit the patient was educated and counseled about appropriate screening and preventive services including : fall prevention , diabetes screening, nutrition counseling, colorectal cancer screening, and recommended immunizations.    CC: The primary encounter diagnosis was Chronic fatigue syndrome. Diagnoses of Prediabetes, Dyslipidemia, Generalized anxiety disorder, Irritable bowel syndrome with both constipation and diarrhea, Tachycardia, and Visit for preventive health examination were also pertinent to this visit.  1) chronic IBS with diarrhea x 20 yrs . Several episodes of explosive stools followed by  discolored vaginal discharge .    2) seeing Maryruth Bun:  for chronic fatigue.   taking cymbalta and vyvanse ,  notes increased sleeplessness  and nightmares if she takes the cymbalta in later afternoon   3)   History Brandi Douglas has a past medical history of Chronic fatigue syndrome, Generalized anxiety disorder, and Irritable bowel syndrome.   She has a past surgical history that includes Cholecystectomy.   Her family history includes Hypertension (age of onset: 60) in her father; Hypothyroidism in her mother; Testicular cancer (age of onset: 17) in her son.She reports that she has never smoked. She has never used smokeless tobacco. She reports that she does not drink alcohol and does not use drugs.  Outpatient Medications Prior to Visit  Medication Sig Dispense  Refill   ALPRAZolam (XANAX) 0.5 MG tablet Take 0.5-1 tablets (0.25-0.5 mg total) by mouth 2 (two) times daily as needed. For anxiety 30 tablet 5   DULoxetine (CYMBALTA) 60 MG capsule Take 60 mg by mouth daily.      hyoscyamine (LEVSIN SL) 0.125 MG SL  tablet Place 1 tablet (0.125 mg total) under the tongue every 4 (four) hours as needed for cramping. 30 tablet 3   lisdexamfetamine (VYVANSE) 20 MG capsule Take 1 capsule by mouth daily.     propranolol (INDERAL) 10 MG tablet Take 1 tablet (10 mg total) by mouth 3 (three) times daily as needed. 90 tablet 0   SUMAtriptan (IMITREX) 100 MG tablet TAKE 1 TABLET BY MOUTH AT ONSET OF HEAD-ACHE. MAY TAKE AN ADDITIONAL TABLET IN 2 HOURS IF NEEDED. MAX 2/24. 10 tablet 3   omeprazole (PRILOSEC) 40 MG capsule Take 1 capsule (40 mg total) by mouth daily. MUST KEEP SCHEDULED APPT 11/21/22 FOR 90 DAY SUPPLY 30 capsule 0   ondansetron (ZOFRAN) 8 MG tablet Take 1 tablet (8 mg total) by mouth every 8 (eight) hours as needed. For nausea 20 tablet 5   tolterodine (DETROL LA) 4 MG 24 hr capsule TAKE 1 CAPSULE BY MOUTH ONCE DAILY 90 capsule 1   No facility-administered medications prior to visit.    Review of Systems  .Patient denies headache, fevers, malaise, unintentional weight loss, skin rash, eye pain, sinus congestion and sinus pain, sore throat, dysphagia,  hemoptysis , cough, dyspnea, wheezing, chest pain, palpitations, orthopnea, edema, abdominal pain, nausea, melena, diarrhea, constipation, flank pain, dysuria, hematuria, urinary  Frequency, nocturia, numbness, tingling, seizures,  Focal weakness, Loss of consciousness,  Tremor, insomnia, depression, anxiety, and suicidal ideation.    Objective:  BP 122/60   Pulse 92   Temp 98 F (36.7 C) (Oral)   Ht 5\' 6"  (1.676 m)   Wt 156 lb 9.6 oz (71 kg)   SpO2 98%   BMI 25.28 kg/m   Physical Exam Vitals reviewed.  Constitutional:      General: She is not in acute distress.    Appearance: Normal appearance. She is well-developed and normal weight. She is not ill-appearing, toxic-appearing or diaphoretic.  HENT:     Head: Normocephalic.     Right Ear: Tympanic membrane, ear canal and external ear normal. There is no impacted cerumen.     Left Ear: Tympanic  membrane, ear canal and external ear normal. There is no impacted cerumen.     Nose: Nose normal.     Mouth/Throat:     Mouth: Mucous membranes are moist.     Pharynx: Oropharynx is clear.  Eyes:     General: No scleral icterus.       Right eye: No discharge.        Left eye: No discharge.     Conjunctiva/sclera: Conjunctivae normal.     Pupils: Pupils are equal, round, and reactive to light.  Neck:     Thyroid: No thyromegaly.     Vascular: No carotid bruit or JVD.  Cardiovascular:     Rate and Rhythm: Normal rate and regular rhythm.     Heart sounds: Normal heart sounds.  Pulmonary:     Effort: Pulmonary effort is normal. No respiratory distress.     Breath sounds: Normal breath sounds.  Chest:  Breasts:    Breasts are symmetrical.     Right: Normal. No swelling, inverted nipple, mass, nipple discharge, skin change or tenderness.  Left: Normal. No swelling, inverted nipple, mass, nipple discharge, skin change or tenderness.  Abdominal:     General: Bowel sounds are normal.     Palpations: Abdomen is soft. There is no mass.     Tenderness: There is no abdominal tenderness. There is no guarding or rebound.  Musculoskeletal:        General: Normal range of motion.     Cervical back: Normal range of motion and neck supple.  Lymphadenopathy:     Cervical: No cervical adenopathy.     Upper Body:     Right upper body: No supraclavicular, axillary or pectoral adenopathy.     Left upper body: No supraclavicular, axillary or pectoral adenopathy.  Skin:    General: Skin is warm and dry.  Neurological:     General: No focal deficit present.     Mental Status: She is alert and oriented to person, place, and time. Mental status is at baseline.  Psychiatric:        Mood and Affect: Mood normal.        Behavior: Behavior normal.        Thought Content: Thought content normal.        Judgment: Judgment normal.      Assessment & Plan:  Chronic fatigue syndrome Assessment &  Plan: Continue Vyvanse prescribed by Dr. Maryruth Bun  and exercise.   Orders: -     CBC with Differential/Platelet -     TSH  Prediabetes -     Hemoglobin A1c -     Comprehensive metabolic panel  Dyslipidemia -     LDL cholesterol, direct -     Lipid panel  Generalized anxiety disorder Assessment & Plan: Managed by Dr Maryruth Bun with  cymbalta.  Improved insomnia noted by patient as long as she takes it by noon     Irritable bowel syndrome with both constipation and diarrhea Assessment & Plan: Alternates between diarrhea and constipation   Tachycardia Assessment & Plan: Chronic.  Screening labs normal.   Cardiology evaluation  done.  Prn inderal ordered  but she limites use because she  recalls a history of beta blocker intolerance.     Visit for preventive health examination Assessment & Plan: age appropriate education and counseling updated, referrals for preventative services and immunizations addressed, dietary and smoking counseling addressed, most recent labs reviewed.  I have personally reviewed and have noted:   1) the patient's medical and social history 2) The pt's use of alcohol, tobacco, and illicit drugs 3) The patient's current medications and supplements 4) Functional ability including ADL's, fall risk, home safety risk, hearing and visual impairment 5) Diet and physical activities 6) Evidence for depression or mood disorder 7) The patient's height, weight, and BMI have been recorded in the chart  I have made referrals, and provided counseling and education based on review of the above    Other orders -     Omeprazole; Take 1 capsule (40 mg total) by mouth daily. MUST KEEP SCHEDULED APPT 11/21/22 FOR 90 DAY SUPPLY  Dispense: 90 capsule; Refill: 3 -     Solifenacin Succinate; Take 1 tablet (10 mg total) by mouth daily.  Dispense: 90 tablet; Refill: 1 -     Ondansetron HCl; Take 1 tablet (8 mg total) by mouth every 8 (eight) hours as needed. For nausea  Dispense: 20  tablet; Refill: 5      Follow-up: No follow-ups on file.   Sherlene Shams, MD

## 2022-11-21 NOTE — Patient Instructions (Addendum)
Detrol LA has been changed to [employer]  for cost savings:    you can take once daily or ever other day    Your tetanus booster is due this summer.  The Tdap (tetanus-diphtheria-whooping cough vaccine ) and Shingrix vaccines are now  COVERED BY MEDICARE if you get them at your pharmacy as of  January 1.   You can expect 24 hours of flu like symptoms after receiving the shingles vaccine, so plan accordingly.

## 2022-11-21 NOTE — Assessment & Plan Note (Signed)

## 2022-11-21 NOTE — Assessment & Plan Note (Signed)
Alternates between diarrhea and constipation

## 2022-11-22 LAB — TSH: TSH: 1.41 u[IU]/mL (ref 0.35–5.50)

## 2022-11-22 NOTE — Addendum Note (Signed)
Addended by: Sherlene Shams on: 11/22/2022 10:13 PM   Modules accepted: Level of Service

## 2022-12-31 ENCOUNTER — Other Ambulatory Visit: Payer: Self-pay | Admitting: Internal Medicine

## 2023-03-23 ENCOUNTER — Encounter: Payer: Self-pay | Admitting: Internal Medicine

## 2023-04-28 ENCOUNTER — Encounter: Payer: Self-pay | Admitting: Internal Medicine

## 2023-04-28 ENCOUNTER — Telehealth: Payer: Medicare HMO | Admitting: Internal Medicine

## 2023-04-28 VITALS — BP 120/78 | HR 91 | Ht 66.0 in | Wt 158.0 lb

## 2023-04-28 DIAGNOSIS — F411 Generalized anxiety disorder: Secondary | ICD-10-CM

## 2023-04-28 DIAGNOSIS — K2100 Gastro-esophageal reflux disease with esophagitis, without bleeding: Secondary | ICD-10-CM

## 2023-04-28 DIAGNOSIS — K582 Mixed irritable bowel syndrome: Secondary | ICD-10-CM | POA: Diagnosis not present

## 2023-04-28 DIAGNOSIS — G9332 Myalgic encephalomyelitis/chronic fatigue syndrome: Secondary | ICD-10-CM | POA: Diagnosis not present

## 2023-04-28 MED ORDER — PANTOPRAZOLE SODIUM 40 MG PO TBEC: 40.0000 mg | DELAYED_RELEASE_TABLET | Freq: Every day | ORAL | 2 refills | Status: DC

## 2023-04-28 NOTE — Assessment & Plan Note (Signed)
Alternates between diarrhea followed by and constipation with bloating.  Aggravated by changes in routine/ travel , uses hyoscyamine prn

## 2023-04-28 NOTE — Patient Instructions (Signed)
I have prescribed protonix to use as your PPI,    I also recommend adding famotidine 20 15 minutes before meals

## 2023-04-28 NOTE — Assessment & Plan Note (Signed)
Managed by Dr Maryruth Bun with  cymbalta.  Improved insomnia noted by patient as long as she takes it by noon

## 2023-04-28 NOTE — Progress Notes (Unsigned)
Virtual Visit via Caregility   Note   This format is felt to be most appropriate for this patient at this time.  All issues noted in this document were discussed and addressed.  No physical exam was performed (except for noted visual exam findings with Video Visits).   I connected withNAME@ on 04/28/23 at  1:30 PM EDT by a video enabled telemedicine application or telephone and verified that I am speaking with the correct person using two identifiers. Location patient: home Location provider: work or home office Persons participating in the virtual visit: patient, provider  I discussed the limitations, risks, security and privacy concerns of performing an evaluation and management service by telephone and the availability of in person appointments. I also discussed with the patient that there may be a patient responsible charge related to this service. The patient expressed understanding and agreed to proceed.  Interactive audio and video telecommunications were attempted between this provider and patient, however failed, due to patient having technical difficulties OR patient did not have access to video capability.  We continued and completed visit with audio only. ***  Reason for visit: 1) Dyspepsia)  2 Chronic fatigue  HPI:  1) Dyspepsia a not tolerating omeprazole,  causing nightmares  2)  Chronic fatigue    72 yr old female with chronic fatigue  managed with vyvanse, GAD managed with cymbalta,  IBS  presents with "upset stomach " ,  has had GERD for many years and has tried omeprazole, nexium and prevacid with varying degrees of success  Chronic fatigue   ROS: See pertinent positives and negatives per HPI.  Past Medical History:  Diagnosis Date   Chronic fatigue syndrome    Generalized anxiety disorder    Irritable bowel syndrome     Past Surgical History:  Procedure Laterality Date   CHOLECYSTECTOMY      Family History  Problem Relation Age of Onset   Hypothyroidism  Mother    Hypertension Father 26       died in 04-Mar-2023 of complications during pacemaker insertion    Testicular cancer Son 33    SOCIAL HX: ***   Current Outpatient Medications:    ALPRAZolam (XANAX) 0.5 MG tablet, Take 0.5-1 tablets (0.25-0.5 mg total) by mouth 2 (two) times daily as needed. For anxiety, Disp: 30 tablet, Rfl: 5   DULoxetine (CYMBALTA) 60 MG capsule, Take 60 mg by mouth daily. , Disp: , Rfl:    hyoscyamine (LEVSIN SL) 0.125 MG SL tablet, Place 1 tablet (0.125 mg total) under the tongue every 4 (four) hours as needed for cramping., Disp: 30 tablet, Rfl: 3   lisdexamfetamine (VYVANSE) 20 MG capsule, Take 1 capsule by mouth daily., Disp: , Rfl:    omeprazole (PRILOSEC) 40 MG capsule, Take 1 capsule (40 mg total) by mouth daily. MUST KEEP SCHEDULED APPT 11/21/22 FOR 90 DAY SUPPLY, Disp: 90 capsule, Rfl: 3   ondansetron (ZOFRAN) 8 MG tablet, Take 1 tablet (8 mg total) by mouth every 8 (eight) hours as needed. For nausea, Disp: 20 tablet, Rfl: 5   propranolol (INDERAL) 10 MG tablet, Take 1 tablet (10 mg total) by mouth 3 (three) times daily as needed., Disp: 90 tablet, Rfl: 0   solifenacin (VESICARE) 10 MG tablet, Take 1 tablet (10 mg total) by mouth daily., Disp: 90 tablet, Rfl: 1   SUMAtriptan (IMITREX) 100 MG tablet, TAKE 1 TABLET BY MOUTH AT ONSET OF HEAD-ACHE. MAY TAKE AN ADDITIONAL TABLET IN 2 HOURS IF NEEDED. MAX 2/24., Disp:  10 tablet, Rfl: 3  EXAM:  VITALS per patient if applicable:  GENERAL: alert, oriented, appears well and in no acute distress  HEENT: atraumatic, conjunttiva clear, no obvious abnormalities on inspection of external nose and ears  NECK: normal movements of the head and neck  LUNGS: on inspection no signs of respiratory distress, breathing rate appears normal, no obvious gross SOB, gasping or wheezing  CV: no obvious cyanosis  MS: moves all visible extremities without noticeable abnormality  PSYCH/NEURO: pleasant and cooperative, no obvious  depression or anxiety, speech and thought processing grossly intact  ASSESSMENT AND PLAN: There are no diagnoses linked to this encounter.    I discussed the assessment and treatment plan with the patient. The patient was provided an opportunity to ask questions and all were answered. The patient agreed with the plan and demonstrated an understanding of the instructions.   The patient was advised to call back or seek an in-person evaluation if the symptoms worsen or if the condition fails to improve as anticipated.   I spent 30 minutes dedicated to the care of this patient on the date of this encounter to include pre-visit review of his medical history,  Face-to-face time with the patient , and post visit ordering of testing and therapeutics.    Sherlene Shams, MD

## 2023-04-28 NOTE — Assessment & Plan Note (Signed)
Managed with 20 mg  Vyvanse prescribed by Dr. Maryruth Bun  and regular participation in exercise.

## 2023-04-30 NOTE — Assessment & Plan Note (Signed)
Given her history of other  treatment failures.  Trial of NBO Protonix Resuming nexium, as H2 blockers have failed.

## 2023-12-20 LAB — HM MAMMOGRAPHY

## 2024-02-28 ENCOUNTER — Other Ambulatory Visit: Payer: Self-pay | Admitting: Internal Medicine

## 2024-04-06 ENCOUNTER — Other Ambulatory Visit: Payer: Self-pay | Admitting: Internal Medicine

## 2024-04-22 ENCOUNTER — Ambulatory Visit: Admitting: Internal Medicine

## 2024-05-28 ENCOUNTER — Telehealth: Payer: Self-pay

## 2024-05-28 DIAGNOSIS — E785 Hyperlipidemia, unspecified: Secondary | ICD-10-CM

## 2024-05-28 DIAGNOSIS — R5383 Other fatigue: Secondary | ICD-10-CM

## 2024-05-28 DIAGNOSIS — R7303 Prediabetes: Secondary | ICD-10-CM

## 2024-05-28 NOTE — Addendum Note (Signed)
 Addended by: Chantz Montefusco on: 05/28/2024 04:47 PM   Modules accepted: Orders

## 2024-05-28 NOTE — Addendum Note (Signed)
 Addended by: MARYLYNN VERNEITA CROME on: 05/28/2024 05:00 PM   Modules accepted: Orders

## 2024-05-28 NOTE — Telephone Encounter (Signed)
 Copied from CRM #[id number]. Topic: Clinical - Request for Lab/Test Order >> May 28, 2024  4:17 PM [name] wrote: Reason for CRM: Patient called in wanting to be scheduled for lab appointment, would like order to be in to be scheduled

## 2024-05-28 NOTE — Telephone Encounter (Signed)
 I have pended labs for your approval. If needed please let me know so I can schedule pt for a lab appt.

## 2024-05-29 NOTE — Telephone Encounter (Signed)
 Spoke with pt and scheduled her for her physical and lab appt

## 2024-06-18 ENCOUNTER — Ambulatory Visit

## 2024-06-19 NOTE — Telephone Encounter (Signed)
 TSH already ordered, but pt was last seen in Oct 204 (virtual visit).

## 2024-06-19 NOTE — Telephone Encounter (Unsigned)
 Copied from CRM [id number]. Topic: Clinical - Request for Lab/Test Order >> Jun 19, 2024  2:38 PM Victoria A wrote: Reason for CRM: Patient would like thyroid  and vit B levels check for lab tomorrow-said she is feeling really tired

## 2024-06-20 ENCOUNTER — Other Ambulatory Visit

## 2024-06-20 ENCOUNTER — Other Ambulatory Visit: Payer: Self-pay | Admitting: Internal Medicine

## 2024-06-20 DIAGNOSIS — R5383 Other fatigue: Secondary | ICD-10-CM

## 2024-06-20 DIAGNOSIS — E785 Hyperlipidemia, unspecified: Secondary | ICD-10-CM | POA: Diagnosis not present

## 2024-06-20 DIAGNOSIS — R7303 Prediabetes: Secondary | ICD-10-CM

## 2024-06-20 LAB — CBC WITH DIFFERENTIAL/PLATELET
Basophils Absolute: 0 K/uL (ref 0.0–0.1)
Basophils Relative: 0.2 % (ref 0.0–3.0)
Eosinophils Absolute: 0 K/uL (ref 0.0–0.7)
Eosinophils Relative: 0.5 % (ref 0.0–5.0)
HCT: 39.2 % (ref 36.0–46.0)
Hemoglobin: 13.9 g/dL (ref 12.0–15.0)
Lymphocytes Relative: 23.7 % (ref 12.0–46.0)
Lymphs Abs: 1.2 K/uL (ref 0.7–4.0)
MCHC: 35.3 g/dL (ref 30.0–36.0)
MCV: 91.2 fl (ref 78.0–100.0)
Monocytes Absolute: 0.4 K/uL (ref 0.1–1.0)
Monocytes Relative: 7.2 % (ref 3.0–12.0)
Neutro Abs: 3.5 K/uL (ref 1.4–7.7)
Neutrophils Relative %: 68.4 % (ref 43.0–77.0)
Platelets: 195 K/uL (ref 150.0–400.0)
RBC: 4.3 Mil/uL (ref 3.87–5.11)
RDW: 12.2 % (ref 11.5–15.5)
WBC: 5.1 K/uL (ref 4.0–10.5)

## 2024-06-20 LAB — COMPREHENSIVE METABOLIC PANEL WITH GFR
ALT: 13 U/L (ref 3–35)
AST: 15 U/L (ref 5–37)
Albumin: 4.4 g/dL (ref 3.5–5.2)
Alkaline Phosphatase: 76 U/L (ref 39–117)
BUN: 13 mg/dL (ref 6–23)
CO2: 27 meq/L (ref 19–32)
Calcium: 9.7 mg/dL (ref 8.4–10.5)
Chloride: 104 meq/L (ref 96–112)
Creatinine, Ser: 0.84 mg/dL (ref 0.40–1.20)
GFR: 69.62 mL/min (ref >60.00)
Glucose, Bld: 106 mg/dL — ABNORMAL HIGH (ref 70–99)
Potassium: 3.7 meq/L (ref 3.5–5.1)
Sodium: 140 meq/L (ref 135–145)
Total Bilirubin: 0.7 mg/dL (ref 0.2–1.2)
Total Protein: 6.6 g/dL (ref 6.0–8.3)

## 2024-06-20 LAB — B12 AND FOLATE PANEL
Folate: 22.4 ng/mL (ref >5.9)
Vitamin B-12: 361 pg/mL (ref 211–911)

## 2024-06-20 LAB — LDL CHOLESTEROL, DIRECT: Direct LDL: 100 mg/dL

## 2024-06-20 LAB — LIPID PANEL
Cholesterol: 172 mg/dL (ref 28–200)
HDL: 60.9 mg/dL (ref >39.00)
LDL Cholesterol: 94 mg/dL (ref 10–99)
NonHDL: 110.7
Total CHOL/HDL Ratio: 3
Triglycerides: 86 mg/dL (ref 10.0–149.0)
VLDL: 17.2 mg/dL (ref 0.0–40.0)

## 2024-06-20 LAB — HEMOGLOBIN A1C: Hgb A1c MFr Bld: 4.9 % (ref 4.6–6.5)

## 2024-06-20 LAB — TSH: TSH: 1.55 u[IU]/mL (ref 0.35–5.50)

## 2024-06-22 ENCOUNTER — Ambulatory Visit: Payer: Self-pay | Admitting: Internal Medicine

## 2024-07-08 ENCOUNTER — Other Ambulatory Visit

## 2024-07-10 ENCOUNTER — Ambulatory Visit: Admitting: Internal Medicine

## 2024-07-10 ENCOUNTER — Encounter: Payer: Self-pay | Admitting: Internal Medicine

## 2024-07-10 VITALS — BP 130/78 | HR 103 | Temp 97.4°F | Ht 66.0 in | Wt 158.0 lb

## 2024-07-10 DIAGNOSIS — R531 Weakness: Secondary | ICD-10-CM

## 2024-07-10 DIAGNOSIS — Z0000 Encounter for general adult medical examination without abnormal findings: Secondary | ICD-10-CM | POA: Diagnosis not present

## 2024-07-10 DIAGNOSIS — G9332 Myalgic encephalomyelitis/chronic fatigue syndrome: Secondary | ICD-10-CM | POA: Diagnosis not present

## 2024-07-10 DIAGNOSIS — R000 Tachycardia, unspecified: Secondary | ICD-10-CM

## 2024-07-10 DIAGNOSIS — K582 Mixed irritable bowel syndrome: Secondary | ICD-10-CM | POA: Diagnosis not present

## 2024-07-10 DIAGNOSIS — Z1211 Encounter for screening for malignant neoplasm of colon: Secondary | ICD-10-CM

## 2024-07-10 DIAGNOSIS — M6281 Muscle weakness (generalized): Secondary | ICD-10-CM

## 2024-07-10 NOTE — Progress Notes (Signed)
 Patient ID: Brandi Douglas, female    DOB: 23-Jul-1950  Age: 74 y.o. MRN: [id number]  The patient is here for annual PREVENTIVE examination and management of other chronic and acute problems. Last seen virtually in Oct  2024    The risk factors are reflected in the social history.  The roster of all physicians providing medical care to patient - is listed in the Snapshot section of the chart.  Activities of daily living:  The patient is 100% independent in all ADLs: dressing, toileting, feeding as well as independent mobility  Home safety : The patient has smoke detectors in the home. They wear seatbelts.  There are no firearms at home. There is no violence in the home.   There is no risks for hepatitis, STDs or HIV. There is no   history of blood transfusion. They have no travel history to infectious disease endemic areas of the world.  The patient has seen their dentist in the last six month. They have seen their eye doctor in the last year. They admit to slight hearing difficulty with regard to whispered voices and some television programs.  They have deferred audiologic testing in the last year.  They do not  have excessive sun exposure. Discussed the need for sun protection: hats, long sleeves and use of sunscreen if there is significant sun exposure.   Diet: the importance of a healthy diet is discussed. They do have a healthy diet.  The benefits of regular aerobic exercise were discussed. She is unable to exercise regularly due to chronic fatigue syndrome rendering her too weak on several days per week .   Depression screen: there are no signs or vegative symptoms of depression- irritability, change in appetite, anhedonia, sadness/tearfullness.  Cognitive assessment: the patient manages all their financial and personal affairs and is actively engaged. They could relate day,date,year and events; recalled 2/3 objects at 3 minutes; performed clock-face test normally.  The following portions  of the patient's history were reviewed and updated as appropriate: allergies, current medications, past family history, past medical history,  past surgical history, past social history  and problem list.  Visual acuity was not assessed per patient preference since she has regular follow up with her ophthalmologist. Hearing and body mass index were assessed and reviewed.   During the course of the visit the patient was educated and counseled about appropriate screening and preventive services including : fall prevention , diabetes screening, nutrition counseling, colorectal cancer screening, and recommended immunizations.    CC: The primary encounter diagnosis was Visit for preventive health examination. Diagnoses of Colon cancer screening, Tachycardia, Muscle weakness (generalized), Generalized weakness, Irritable bowel syndrome with both constipation and diarrhea, Tachycardia, unspecified, and Chronic fatigue syndrome were also pertinent to this visit.  Not doing well  due to untreated CHRONIC FATIGUE: PATIENT SEEING DR CHIPPER ,  generic stimulants stopped due to excessive sympathetic stimulation causing tachycardia  and tremulousness . Brandi Douglas  Different generics tried by Dr Chipper. Brandi Douglas Has been on stimulants for the past 24 years.  Feels tired,  jittery at times. Wants to sleep all the time.  Hasn't driven in several weeks due to feeling too tired, too weak to drive.  Husband with her today . Took the fly vaccine high dose 10 days ago. Made her feel worse for several days, became hypersomnolent . 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  Has been referred  to  Dr Tobie, Rheumatology.   Has not been exercising consistently for years , states that there are times she is too weak to even walk ,  but denies pain  , just profound fatigue , but has good days where she is able to garden   Husband is present and is concerned that her  thyroid  is the problem in spite of thyroid  studies being normal.     2)   bloating, IBS, stomach cramps,  used to take  Propulsid . Ibergast (peppermint,  licorice chamomile, dandelion and candytuft)    History Brandi Douglas has a past medical history of Chronic fatigue syndrome, Generalized anxiety disorder, and Irritable bowel syndrome.   She has a past surgical history that includes Cholecystectomy.   Her family history includes Hypertension (age of onset: 36) in her father; Hypothyroidism in her mother; Testicular cancer (age of onset: 6) in her son.She reports that she has never smoked. She has never used smokeless tobacco. She reports that she does not drink alcohol and does not use drugs.  Outpatient Medications Prior to Visit  Medication Sig Dispense Refill   ALPRAZolam  (XANAX ) 0.5 MG tablet Take 0.5-1 tablets (0.25-0.5 mg total) by mouth 2 (two) times daily as needed. For anxiety 30 tablet 5   DULoxetine  (CYMBALTA ) 60 MG capsule Take 60 mg by mouth daily.      hyoscyamine  (LEVSIN  SL) 0.125 MG SL tablet Place 1 tablet (0.125 mg total) under the tongue every 4 (four) hours as needed for cramping. 30 tablet 3   lansoprazole (PREVACID) 30 MG capsule Take 30 mg by mouth daily at 12 noon.     ondansetron  (ZOFRAN ) 8 MG tablet TAKE 1 TABLET BY MOUTH EVERY 8 HOURS AS NEEDED FOR NAUSEA 20 tablet 5   solifenacin  (VESICARE ) 10 MG tablet TAKE 1 TABLET BY MOUTH ONCE DAILY 90 tablet 1   SUMAtriptan  (IMITREX ) 100 MG tablet TAKE 1 TABLET BY MOUTH AT ONSET OF HEAD-ACHE. MAY TAKE AN ADDITIONAL TABLET IN 2 HOURS IF NEEDED. MAX 2/24. 10 tablet 3   lisdexamfetamine  (VYVANSE ) 20 MG capsule Take 1 capsule by mouth daily. (Patient not taking: Reported on 07/10/2024)     omeprazole  (PRILOSEC) 40 MG capsule Take 1 capsule (40 mg total) by mouth daily. MUST KEEP SCHEDULED APPT 11/21/22 FOR 90 DAY SUPPLY (Patient not taking: Reported on 07/10/2024) 90 capsule 3   pantoprazole  (PROTONIX ) 40 MG tablet Take 1 tablet (40 mg  total) by mouth daily. 30 tablet 2   propranolol  (INDERAL ) 10 MG tablet Take 1 tablet (10 mg total) by mouth 3 (three) times daily as needed. 90 tablet 0   No facility-administered medications prior to visit.    Review of Systems  Patient denies headache, fevers, malaise, unintentional weight loss, skin rash, eye pain, sinus congestion and sinus pain, sore throat, dysphagia,  hemoptysis , cough, dyspnea, wheezing, chest pain, palpitations, orthopnea, edema, abdominal pain, nausea, melena, diarrhea, constipation, flank pain, dysuria, hematuria, urinary  Frequency, nocturia, numbness, tingling, seizures,  Focal weakness, Loss of consciousness,  Tremor, insomnia, depression, anxiety, and suicidal ideation.     Objective:  BP 130/78 (BP Location: Left Arm)   Pulse (!) 103   Temp (!) 97.4 F (36.3 C)   Ht 5' 6 (1.676 m)   Wt 158 lb (71.7 kg)   SpO2 98%   BMI 25.50 kg/m   Physical Exam Vitals reviewed.  Constitutional:      General: She is not in acute distress.    Appearance: Normal appearance.  She is well-developed and normal weight. She is not ill-appearing, toxic-appearing or diaphoretic.  HENT:     Head: Normocephalic.     Right Ear: Tympanic membrane, ear canal and external ear normal. There is no impacted cerumen.     Left Ear: Tympanic membrane, ear canal and external ear normal. There is no impacted cerumen.     Nose: Nose normal.     Mouth/Throat:     Mouth: Mucous membranes are moist.     Pharynx: Oropharynx is clear.  Eyes:     General: No scleral icterus.       Right eye: No discharge.        Left eye: No discharge.     Conjunctiva/sclera: Conjunctivae normal.     Pupils: Pupils are equal, round, and reactive to light.  Neck:     Thyroid : No thyromegaly.     Vascular: No carotid bruit or JVD.  Cardiovascular:     Rate and Rhythm: Normal rate and regular rhythm.     Heart sounds: Normal heart sounds.  Pulmonary:     Effort: Pulmonary effort is normal. No  respiratory distress.     Breath sounds: Normal breath sounds.  Chest:  Breasts:    Breasts are symmetrical.     Right: Normal. No swelling, inverted nipple, mass, nipple discharge, skin change or tenderness.     Left: Normal. No swelling, inverted nipple, mass, nipple discharge, skin change or tenderness.  Abdominal:     General: Bowel sounds are normal.     Palpations: Abdomen is soft. There is no mass.     Tenderness: There is no abdominal tenderness. There is no guarding or rebound.  Musculoskeletal:        General: Normal range of motion.     Cervical back: Normal range of motion and neck supple.  Lymphadenopathy:     Cervical: No cervical adenopathy.     Upper Body:     Right upper body: No supraclavicular, axillary or pectoral adenopathy.     Left upper body: No supraclavicular, axillary or pectoral adenopathy.  Skin:    General: Skin is warm and dry.  Neurological:     General: No focal deficit present.     Mental Status: She is alert and oriented to person, place, and time. Mental status is at baseline.     Motor: No weakness or tremor.     Coordination: Coordination normal.     Deep Tendon Reflexes: Reflexes normal.     Reflex Scores:      Patellar reflexes are 3+ on the right side and 3+ on the left side.    Comments: No exertional weakness   Psychiatric:        Mood and Affect: Mood normal.        Behavior: Behavior normal.        Thought Content: Thought content normal.        Judgment: Judgment normal.     Assessment & Plan:  Visit for preventive health examination Assessment & Plan: age appropriate education and counseling updated, referrals for preventative services and immunizations addressed, dietary and smoking counseling addressed, most recent labs reviewed.  I have personally reviewed and have noted:   1) the patient's medical and social history 2) The pt's use of alcohol, tobacco, and illicit drugs 3) The patient's current medications and  supplements 4) Functional ability including ADL's, fall risk, home safety risk, hearing and visual impairment 5) Diet and physical activities 6) Evidence for depression  or mood disorder 7) The patient's height, weight, and BMI have been recorded in the chart 8) I have ordered and reviewed a 12 lead EKG and find that there are no acute changes and patient is in sinus rhythm.     I have made referrals, and provided counseling and education based on review of the above    Colon cancer screening -     Ambulatory referral to Gastroenterology  Tachycardia Assessment & Plan: Chronic, etiology unclear with prior noninvasive working 2023 noting normal EF on ECHO.  Deconditioning likely contributing .  May be dehydratied. Repeat EKG unchanged.   Orders: -     EKG 12-Lead -     Thyroid  Panel With TSH  Muscle weakness (generalized) -     CK  Generalized weakness Assessment & Plan: Exam is normal today,  no weakness elicited with repetitive testing.  DTRS are brisk .  Symptoms wax and wane but have been debilitating and activity limiting.  She has been referred to Dr, Tobie , rheumatology,  labs done today in anticipation of visit in [place].  I have recommended neurology evaluation for EMG/[place] studies   Orders: -     CK -     ANA -     AntiMicrosomal Ab-Liver / Kidney -     Anti-Smith antibody -     Anti-smooth muscle antibody, IgG -     Ceruloplasmin -     IBC + Ferritin -     Mitochondrial antibodies -     IgG -     B12 and Folate Panel -     Ambulatory referral to Neurology  Irritable bowel syndrome with both constipation and diarrhea Assessment & Plan: Managed previouly with propulsid, now with natural supplements.    Tachycardia, unspecified Assessment & Plan: Chronic, etiology unclear with prior noninvasive working 2023 noting normal EF on ECHO.  Deconditioning likely contributing .  May be dehydratied. Repeat EKG unchanged.    Chronic fatigue syndrome Assessment &  Plan: Currently untreated .Diagnosed in 2014 and treated  by Dr Chipper for years with name brand stimulants until her intolerance of generic medications due to side of jitteriness  and tachycardia        I provided 40 minutes of  face-to-face time during this encounter reviewing patient's current problems and past surgeries,  recent labs and imaging studies, providing counseling on the above mentioned problems , and coordination  of care .   Follow-up: Return in about 6 months (around 01/07/2025).   Verneita LITTIE Kettering, MD

## 2024-07-10 NOTE — Patient Instructions (Addendum)
 It is recommend that you have the following vaccinations:  TDAP (last one 2014) Pneumonia (never before )  Shingrix (new shingles vaccine)  I have ordered the autoimmune tests that Dr Tobie will need and made a referral to Dr [facility])

## 2024-07-11 DIAGNOSIS — R531 Weakness: Secondary | ICD-10-CM | POA: Insufficient documentation

## 2024-07-11 LAB — CK: Total CK: 48 U/L (ref 17–177)

## 2024-07-11 LAB — IBC + FERRITIN
Ferritin: 60 ng/mL (ref 10.0–291.0)
Iron: 114 ug/dL (ref 42–145)
Saturation Ratios: 36.4 % (ref 20.0–50.0)
TIBC: 313.6 ug/dL (ref 250.0–450.0)
Transferrin: 224 mg/dL (ref 212.0–360.0)

## 2024-07-11 LAB — B12 AND FOLATE PANEL
Folate: 17.2 ng/mL
Vitamin B-12: 344 pg/mL (ref 211–911)

## 2024-07-11 LAB — THYROID PANEL WITH TSH
Free Thyroxine Index: 2 (ref 1.2–4.9)
T3 Uptake Ratio: 28 % (ref 24–39)
T4, Total: 7.2 ug/dL (ref 4.5–12.0)
TSH: 1.8 u[IU]/mL (ref 0.450–4.500)

## 2024-07-11 NOTE — Assessment & Plan Note (Addendum)
 Currently untreated .Diagnosed in 2014 and treated  by Dr Chipper for years with name brand stimulants until her intolerance of generic medications due to side of jitteriness  and tachycardia

## 2024-07-11 NOTE — Assessment & Plan Note (Signed)
 Exam is normal today,  no weakness elicited with repetitive testing.  DTRS are brisk .  Symptoms wax and wane but have been debilitating and activity limiting.  She has been referred to Dr, Tobie , rheumatology,  labs done today in anticipation of visit in [place].  I have recommended neurology evaluation for EMG/[place] studies

## 2024-07-11 NOTE — Assessment & Plan Note (Signed)
 Managed previouly with propulsid, now with natural supplements.

## 2024-07-11 NOTE — Assessment & Plan Note (Addendum)
 Chronic, etiology unclear with prior noninvasive working 2023 noting normal EF on ECHO.  Deconditioning likely contributing .  May be dehydratied. Repeat EKG unchanged.

## 2024-07-11 NOTE — Assessment & Plan Note (Signed)

## 2024-07-13 LAB — IGG: IgG (Immunoglobin G), Serum: 754 mg/dL (ref 600–1540)

## 2024-07-13 LAB — ANTI-SMITH ANTIBODY: ENA SM Ab Ser-aCnc: <1 AI

## 2024-07-13 LAB — ANTI-MICROSOMAL ANTIBODY LIVER / KIDNEY: LKM1 Ab: <=20 U

## 2024-07-13 LAB — ANTI-NUCLEAR AB-TITER (ANA TITER): ANA Titer 1: 1:80 {titer} — ABNORMAL HIGH

## 2024-07-13 LAB — ANTI-SMOOTH MUSCLE ANTIBODY, IGG: Actin (Smooth Muscle) Antibody (IGG): <20 U

## 2024-07-13 LAB — MITOCHONDRIAL ANTIBODIES: Mitochondrial M2 Ab, IgG: <=20 U

## 2024-07-13 LAB — ANA: Anti Nuclear Antibody (ANA): POSITIVE — AB

## 2024-07-13 LAB — CERULOPLASMIN: Ceruloplasmin: 22 mg/dL (ref 14–48)

## 2024-07-14 ENCOUNTER — Ambulatory Visit: Payer: Self-pay | Admitting: Internal Medicine

## 2024-07-31 ENCOUNTER — Ambulatory Visit

## 2025-01-07 ENCOUNTER — Ambulatory Visit: Admitting: Internal Medicine
